# Patient Record
Sex: Male | Born: 1940 | Marital: Married | Smoking: Former smoker
Health system: Southern US, Community
[De-identification: ages and names within clinical notes are randomized; demographics above are authoritative.]

## PROBLEM LIST (undated history)

## (undated) DIAGNOSIS — I219 Acute myocardial infarction, unspecified: Secondary | ICD-10-CM

## (undated) DIAGNOSIS — I2510 Atherosclerotic heart disease of native coronary artery without angina pectoris: Secondary | ICD-10-CM

## (undated) DIAGNOSIS — Z85118 Personal history of other malignant neoplasm of bronchus and lung: Secondary | ICD-10-CM

## (undated) DIAGNOSIS — IMO0002 Reserved for concepts with insufficient information to code with codable children: Secondary | ICD-10-CM

## (undated) DIAGNOSIS — J449 Chronic obstructive pulmonary disease, unspecified: Secondary | ICD-10-CM

## (undated) DIAGNOSIS — E785 Hyperlipidemia, unspecified: Secondary | ICD-10-CM

## (undated) DIAGNOSIS — Z87891 Personal history of nicotine dependence: Secondary | ICD-10-CM

## (undated) DIAGNOSIS — J390 Retropharyngeal and parapharyngeal abscess: Secondary | ICD-10-CM

## (undated) HISTORY — PX: CATARACT EXTRACTION: SUR2

## (undated) HISTORY — PX: COSMETIC SURGERY: SHX468

## (undated) HISTORY — DX: Atherosclerotic heart disease of native coronary artery without angina pectoris: I25.10

## (undated) HISTORY — DX: Acute myocardial infarction, unspecified: I21.9

## (undated) HISTORY — DX: Retropharyngeal and parapharyngeal abscess: J39.0

## (undated) HISTORY — DX: Reserved for concepts with insufficient information to code with codable children: IMO0002

## (undated) HISTORY — DX: Personal history of nicotine dependence: Z87.891

## (undated) HISTORY — DX: Chronic obstructive pulmonary disease, unspecified: J44.9

## (undated) HISTORY — DX: Hyperlipidemia, unspecified: E78.5

## (undated) HISTORY — DX: Personal history of other malignant neoplasm of bronchus and lung: Z85.118

## (undated) HISTORY — PX: HEMORRHOID SURGERY: SHX153

---

## 2006-09-20 ENCOUNTER — Ambulatory Visit: Payer: Self-pay | Admitting: Ophthalmology

## 2007-10-09 ENCOUNTER — Ambulatory Visit: Payer: Self-pay | Admitting: Gastroenterology

## 2008-07-18 DIAGNOSIS — J390 Retropharyngeal and parapharyngeal abscess: Secondary | ICD-10-CM

## 2008-07-18 HISTORY — DX: Retropharyngeal and parapharyngeal abscess: J39.0

## 2008-08-30 ENCOUNTER — Inpatient Hospital Stay: Payer: Self-pay | Admitting: Otolaryngology

## 2009-02-17 ENCOUNTER — Ambulatory Visit: Payer: Self-pay | Admitting: Internal Medicine

## 2009-02-27 ENCOUNTER — Ambulatory Visit: Payer: Self-pay | Admitting: Internal Medicine

## 2009-03-04 ENCOUNTER — Ambulatory Visit: Payer: Self-pay | Admitting: Gastroenterology

## 2009-04-07 ENCOUNTER — Ambulatory Visit: Payer: Self-pay | Admitting: Surgery

## 2009-07-18 DIAGNOSIS — IMO0002 Reserved for concepts with insufficient information to code with codable children: Secondary | ICD-10-CM

## 2009-07-18 HISTORY — DX: Reserved for concepts with insufficient information to code with codable children: IMO0002

## 2009-11-14 ENCOUNTER — Ambulatory Visit: Payer: Self-pay | Admitting: Internal Medicine

## 2009-11-16 ENCOUNTER — Ambulatory Visit: Payer: Self-pay | Admitting: Internal Medicine

## 2009-11-20 ENCOUNTER — Ambulatory Visit: Payer: Self-pay | Admitting: Family Medicine

## 2010-03-02 ENCOUNTER — Ambulatory Visit: Payer: Self-pay | Admitting: Internal Medicine

## 2010-03-12 ENCOUNTER — Ambulatory Visit: Payer: Self-pay | Admitting: Internal Medicine

## 2010-03-18 ENCOUNTER — Ambulatory Visit: Payer: Self-pay | Admitting: Oncology

## 2010-03-26 ENCOUNTER — Ambulatory Visit: Payer: Self-pay | Admitting: Internal Medicine

## 2010-04-14 ENCOUNTER — Ambulatory Visit: Payer: Self-pay | Admitting: Oncology

## 2010-04-17 ENCOUNTER — Ambulatory Visit: Payer: Self-pay | Admitting: Oncology

## 2010-05-02 ENCOUNTER — Emergency Department: Payer: Self-pay | Admitting: Emergency Medicine

## 2010-05-18 ENCOUNTER — Ambulatory Visit: Payer: Self-pay | Admitting: Oncology

## 2010-06-17 ENCOUNTER — Ambulatory Visit: Payer: Self-pay | Admitting: Oncology

## 2010-07-14 ENCOUNTER — Ambulatory Visit: Payer: Self-pay | Admitting: Oncology

## 2010-07-18 ENCOUNTER — Ambulatory Visit: Payer: Self-pay | Admitting: Oncology

## 2010-08-18 ENCOUNTER — Ambulatory Visit: Payer: Self-pay | Admitting: Oncology

## 2010-09-16 ENCOUNTER — Ambulatory Visit: Payer: Self-pay | Admitting: Oncology

## 2010-11-15 ENCOUNTER — Ambulatory Visit: Payer: Self-pay | Admitting: Oncology

## 2010-11-16 ENCOUNTER — Ambulatory Visit: Payer: Self-pay | Admitting: Oncology

## 2011-02-07 ENCOUNTER — Ambulatory Visit: Payer: Self-pay | Admitting: Oncology

## 2011-02-08 ENCOUNTER — Ambulatory Visit: Payer: Self-pay | Admitting: Oncology

## 2011-02-16 ENCOUNTER — Ambulatory Visit: Payer: Self-pay | Admitting: Oncology

## 2011-04-13 ENCOUNTER — Ambulatory Visit: Payer: Self-pay | Admitting: Internal Medicine

## 2011-04-18 ENCOUNTER — Ambulatory Visit: Payer: Self-pay | Admitting: Unknown Physician Specialty

## 2011-05-18 ENCOUNTER — Ambulatory Visit: Payer: Self-pay | Admitting: Ophthalmology

## 2011-05-26 ENCOUNTER — Ambulatory Visit: Payer: Self-pay | Admitting: Oncology

## 2011-06-18 ENCOUNTER — Ambulatory Visit: Payer: Self-pay | Admitting: Oncology

## 2011-07-19 ENCOUNTER — Emergency Department: Payer: Self-pay | Admitting: Emergency Medicine

## 2011-07-19 DIAGNOSIS — I219 Acute myocardial infarction, unspecified: Secondary | ICD-10-CM

## 2011-07-19 HISTORY — DX: Acute myocardial infarction, unspecified: I21.9

## 2011-07-19 LAB — COMPREHENSIVE METABOLIC PANEL
Albumin: 4.3 g/dL (ref 3.4–5.0)
Alkaline Phosphatase: 71 U/L (ref 50–136)
Anion Gap: 10 (ref 7–16)
Bilirubin,Total: 1.9 mg/dL — ABNORMAL HIGH (ref 0.2–1.0)
Calcium, Total: 9.3 mg/dL (ref 8.5–10.1)
Chloride: 103 mmol/L (ref 98–107)
Glucose: 125 mg/dL — ABNORMAL HIGH (ref 65–99)
Osmolality: 283 (ref 275–301)
Potassium: 4.2 mmol/L (ref 3.5–5.1)
SGOT(AST): 26 U/L (ref 15–37)
Sodium: 141 mmol/L (ref 136–145)

## 2011-07-19 LAB — CBC
MCHC: 34.4 g/dL (ref 32.0–36.0)
Platelet: 174 10*3/uL (ref 150–440)
RDW: 13.8 % (ref 11.5–14.5)
WBC: 13.5 10*3/uL — ABNORMAL HIGH (ref 3.8–10.6)

## 2011-07-19 LAB — CK TOTAL AND CKMB (NOT AT ARMC): CK, Total: 389 U/L — ABNORMAL HIGH (ref 35–232)

## 2011-07-25 LAB — EXPECTORATED SPUTUM ASSESSMENT W REFEX TO RESP CULTURE

## 2011-07-27 ENCOUNTER — Ambulatory Visit: Payer: Self-pay | Admitting: Oncology

## 2011-08-18 ENCOUNTER — Ambulatory Visit: Payer: Self-pay | Admitting: Internal Medicine

## 2011-08-19 ENCOUNTER — Ambulatory Visit: Payer: Self-pay | Admitting: Oncology

## 2011-09-12 ENCOUNTER — Ambulatory Visit: Payer: Self-pay | Admitting: Oncology

## 2011-09-14 ENCOUNTER — Ambulatory Visit: Payer: Self-pay | Admitting: Oncology

## 2011-09-14 LAB — COMPREHENSIVE METABOLIC PANEL
Albumin: 4 g/dL (ref 3.4–5.0)
Alkaline Phosphatase: 74 U/L (ref 50–136)
BUN: 12 mg/dL (ref 7–18)
Bilirubin,Total: 1.1 mg/dL — ABNORMAL HIGH (ref 0.2–1.0)
Creatinine: 0.97 mg/dL (ref 0.60–1.30)
EGFR (African American): >60
Potassium: 4.2 mmol/L (ref 3.5–5.1)
SGPT (ALT): 21 U/L
Total Protein: 7.1 g/dL (ref 6.4–8.2)

## 2011-09-14 LAB — CBC CANCER CENTER
Basophil #: 0 x10 3/mm (ref 0.0–0.1)
Basophil %: 0.5 %
Eosinophil #: 0.2 x10 3/mm (ref 0.0–0.7)
Eosinophil %: 4.1 %
HCT: 44 % (ref 40.0–52.0)
Lymphocyte #: 0.8 x10 3/mm — ABNORMAL LOW (ref 1.0–3.6)
MCH: 32.4 pg (ref 26.0–34.0)
MCHC: 34.9 g/dL (ref 32.0–36.0)
MCV: 93 fL (ref 80–100)
Monocyte #: 0.6 x10 3/mm (ref 0.0–0.7)
Monocyte %: 10.2 %
Neutrophil #: 4.1 x10 3/mm (ref 1.4–6.5)
Platelet: 194 x10 3/mm (ref 150–440)
RBC: 4.75 10*6/uL (ref 4.40–5.90)
WBC: 5.8 x10 3/mm (ref 3.8–10.6)

## 2011-09-16 ENCOUNTER — Ambulatory Visit: Payer: Self-pay | Admitting: Oncology

## 2011-10-11 ENCOUNTER — Ambulatory Visit: Payer: Self-pay | Admitting: Internal Medicine

## 2011-10-11 LAB — CBC WITH DIFFERENTIAL/PLATELET
Basophil %: 2.1 %
Lymphocyte #: 0.9 10*3/uL — ABNORMAL LOW (ref 1.0–3.6)
Lymphocyte %: 12 %
MCHC: 33.8 g/dL (ref 32.0–36.0)
Monocyte %: 7.4 %
RBC: 4.88 10*6/uL (ref 4.40–5.90)
RDW: 13 % (ref 11.5–14.5)
WBC: 7.4 10*3/uL (ref 3.8–10.6)

## 2011-10-11 LAB — COMPREHENSIVE METABOLIC PANEL
Albumin: 3.7 g/dL (ref 3.4–5.0)
Alkaline Phosphatase: 88 U/L (ref 50–136)
BUN: 16 mg/dL (ref 7–18)
Bilirubin,Total: 0.9 mg/dL (ref 0.2–1.0)
Calcium, Total: 9.4 mg/dL (ref 8.5–10.1)
Chloride: 105 mmol/L (ref 98–107)
Co2: 33 mmol/L — ABNORMAL HIGH (ref 21–32)
Creatinine: 1.24 mg/dL (ref 0.60–1.30)
EGFR (African American): >60
EGFR (Non-African Amer.): >60
Osmolality: 287 (ref 275–301)
SGOT(AST): 16 U/L (ref 15–37)
Sodium: 142 mmol/L (ref 136–145)
Total Protein: 6.8 g/dL (ref 6.4–8.2)

## 2011-10-17 DIAGNOSIS — I2510 Atherosclerotic heart disease of native coronary artery without angina pectoris: Secondary | ICD-10-CM

## 2011-10-17 HISTORY — DX: Atherosclerotic heart disease of native coronary artery without angina pectoris: I25.10

## 2011-10-17 HISTORY — PX: CORONARY ANGIOPLASTY: SHX604

## 2011-10-26 ENCOUNTER — Inpatient Hospital Stay: Payer: Self-pay | Admitting: Internal Medicine

## 2011-10-26 DIAGNOSIS — R079 Chest pain, unspecified: Secondary | ICD-10-CM

## 2011-10-26 DIAGNOSIS — R748 Abnormal levels of other serum enzymes: Secondary | ICD-10-CM

## 2011-10-26 LAB — COMPREHENSIVE METABOLIC PANEL
Albumin: 4.3 g/dL (ref 3.4–5.0)
Alkaline Phosphatase: 77 U/L (ref 50–136)
BUN: 15 mg/dL (ref 7–18)
Calcium, Total: 9.3 mg/dL (ref 8.5–10.1)
Chloride: 103 mmol/L (ref 98–107)
Co2: 25 mmol/L (ref 21–32)
EGFR (African American): >60
EGFR (Non-African Amer.): >60
Osmolality: 279 (ref 275–301)
SGOT(AST): 19 U/L (ref 15–37)
SGPT (ALT): 23 U/L
Total Protein: 7.6 g/dL (ref 6.4–8.2)

## 2011-10-26 LAB — URINALYSIS, COMPLETE
Bilirubin,UR: NEGATIVE
Ketone: NEGATIVE
Leukocyte Esterase: NEGATIVE
Ph: 5 (ref 4.5–8.0)
Specific Gravity: 1.033 (ref 1.003–1.030)
Squamous Epithelial: <1
WBC UR: 1 /HPF (ref 0–5)

## 2011-10-26 LAB — LIPID PANEL
HDL Cholesterol: 54 mg/dL (ref 40–60)
Ldl Cholesterol, Calc: 129 mg/dL — ABNORMAL HIGH (ref 0–100)
Triglycerides: 101 mg/dL (ref 0–200)

## 2011-10-26 LAB — CBC
MCH: 31 pg (ref 26.0–34.0)
MCHC: 33 g/dL (ref 32.0–36.0)
Platelet: 169 10*3/uL (ref 150–440)
RBC: 5.1 10*6/uL (ref 4.40–5.90)
WBC: 5.6 10*3/uL (ref 3.8–10.6)

## 2011-10-26 LAB — TROPONIN I: Troponin-I: <0.02 ng/mL

## 2011-10-26 LAB — CK TOTAL AND CKMB (NOT AT ARMC)
CK, Total: 66 U/L (ref 35–232)
CK-MB: 127.9 ng/mL — ABNORMAL HIGH (ref 0.5–3.6)
CK-MB: 68.7 ng/mL — ABNORMAL HIGH (ref 0.5–3.6)

## 2011-10-26 LAB — PRO B NATRIURETIC PEPTIDE: B-Type Natriuretic Peptide: 74 pg/mL (ref 0–125)

## 2011-10-27 DIAGNOSIS — I517 Cardiomegaly: Secondary | ICD-10-CM

## 2011-10-27 DIAGNOSIS — I2510 Atherosclerotic heart disease of native coronary artery without angina pectoris: Secondary | ICD-10-CM

## 2011-10-27 LAB — CBC WITH DIFFERENTIAL/PLATELET
Basophil #: 0 10*3/uL (ref 0.0–0.1)
Eosinophil #: 0.2 10*3/uL (ref 0.0–0.7)
Lymphocyte #: 1 10*3/uL (ref 1.0–3.6)
Lymphocyte %: 17.5 %
Monocyte #: 0.5 x10 3/mm (ref 0.2–1.0)
Neutrophil #: 3.9 10*3/uL (ref 1.4–6.5)
RBC: 4.19 10*6/uL — ABNORMAL LOW (ref 4.40–5.90)
RDW: 13.3 % (ref 11.5–14.5)
WBC: 5.5 10*3/uL (ref 3.8–10.6)

## 2011-10-27 LAB — CK TOTAL AND CKMB (NOT AT ARMC)
CK, Total: 352 U/L — ABNORMAL HIGH (ref 35–232)
CK-MB: 32.7 ng/mL — ABNORMAL HIGH (ref 0.5–3.6)

## 2011-10-27 LAB — BASIC METABOLIC PANEL
Anion Gap: 7 (ref 7–16)
Chloride: 105 mmol/L (ref 98–107)
EGFR (African American): >60
Glucose: 100 mg/dL — ABNORMAL HIGH (ref 65–99)
Osmolality: 280 (ref 275–301)
Sodium: 140 mmol/L (ref 136–145)

## 2011-10-27 LAB — TROPONIN I: Troponin-I: 17.91 ng/mL — ABNORMAL HIGH

## 2011-10-28 LAB — BASIC METABOLIC PANEL
Anion Gap: 10 (ref 7–16)
BUN: 11 mg/dL (ref 7–18)
Calcium, Total: 8 mg/dL — ABNORMAL LOW (ref 8.5–10.1)
Chloride: 107 mmol/L (ref 98–107)
Co2: 27 mmol/L (ref 21–32)
EGFR (African American): >60
Glucose: 88 mg/dL (ref 65–99)
Osmolality: 286 (ref 275–301)
Sodium: 144 mmol/L (ref 136–145)

## 2011-11-07 ENCOUNTER — Encounter: Payer: Self-pay | Admitting: Cardiovascular Disease

## 2011-11-07 ENCOUNTER — Ambulatory Visit (INDEPENDENT_AMBULATORY_CARE_PROVIDER_SITE_OTHER): Payer: Medicare Other | Admitting: Cardiovascular Disease

## 2011-11-07 DIAGNOSIS — R079 Chest pain, unspecified: Secondary | ICD-10-CM

## 2011-11-07 DIAGNOSIS — E785 Hyperlipidemia, unspecified: Secondary | ICD-10-CM

## 2011-11-07 DIAGNOSIS — I2510 Atherosclerotic heart disease of native coronary artery without angina pectoris: Secondary | ICD-10-CM | POA: Insufficient documentation

## 2011-11-07 DIAGNOSIS — I429 Cardiomyopathy, unspecified: Secondary | ICD-10-CM | POA: Insufficient documentation

## 2011-11-07 DIAGNOSIS — I428 Other cardiomyopathies: Secondary | ICD-10-CM

## 2011-11-07 NOTE — Assessment & Plan Note (Signed)
The patient had a recent large size non-ST elevation myocardial infarction. He had an angioplasty and drug-eluting stent placement to the proximal LAD which was a difficult procedure due to heavy calcifications resulted in jailing the first diagonal. Since hospital discharge, the patient continued to have mild chest discomfort but has not used nitroglycerin. He is reporting easy bruising with aspirin and Plavix. Thus, I recommend decreasing the dose of aspirin to 81 mg once daily. Continue other cardiac medications. I instructed him on the proper use of sublingual nitroglycerin. I advised him to call 911 if chest pain does not resolve after one nitroglycerin. I asked him to continue to monitor his symptoms to notify us with any unusual chest pain or exertional chest pain. If he continues to have chest pain, I would have a low threshold for coronary angiography to ensure that the LAD stent is patent.

## 2011-11-07 NOTE — Progress Notes (Signed)
HPI  This is a 71 year old man who is here today for a followup visit after recent hospitalization at [facility]. He presented there with non-ST elevation myocardial infarction. Troponin peaked at 40. He underwent cardiac catheterization which showed significant proximal LAD stenosis which was heavily calcified with moderate disease in the OM1. He had an angioplasty and drug-eluting stent placement done by Dr. Juliann Pares. The procedure was difficult to to calcifications and difficulty in fully expanding the stent. The stent was ultimately post dilated with a 3.5 noncompliant balloon with good results. However, the first diagonal was jailed by the stent which resulted in TIMI 1 flow. The patient did have chest pain at that time that did not last for a long time. Since hospital discharge, the patient has been feeling tired and fatigued. He had few episodes of chest pain but has not used nitroglycerin because the discomfort was mild. He is able to perform his activities of daily living without significant exertional symptoms. He has not taken lisinopril due to concerns about low blood pressure and feeling tired.  Allergies  Allergen Reactions  . Aspirin   . Levofloxacin      Current Outpatient Prescriptions on File Prior to Visit  Medication Sig Dispense Refill  . aspirin 325 MG EC tablet Take 325 mg by mouth daily.      Marland Kitchen atorvastatin (LIPITOR) 40 MG tablet Take 40 mg by mouth daily.      . calcium carbonate (TUMS - DOSED IN MG ELEMENTAL CALCIUM) 500 MG chewable tablet Chew 1 tablet by mouth daily.      . Cholecalciferol (VITAMIN D3) 1000 UNITS CAPS Take 1,000 Units by mouth daily.      . clopidogrel (PLAVIX) 75 MG tablet Take 75 mg by mouth daily.      . Cyanocobalamin (VITAMIN B-12 CR PO) Take by mouth.      . metoprolol tartrate (LOPRESSOR) 25 MG tablet Take 25 mg by mouth 2 (two) times daily.      . nitroGLYCERIN (NITROSTAT) 0.4 MG SL tablet Place 0.4 mg under the tongue every 5 (five) minutes as  needed.         Past Medical History  Diagnosis Date  . COPD (chronic obstructive pulmonary disease)   . Abscess of lateral pharyngeal space 2010  . Ex-smoker   . Acute MI 2013  . H/O: lung cancer     in remission  . 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 Coronary artery disease 10/2011    NSTEMI. Cardiac cath: 80% proximal LAD stenosis (heavily calcifed), 60% Om1, EF 40-45%. Proximal LAD PCI and DES placement. Jailed first diagonal with TIMI 1 flow.   . Hyperlipidemia   . Cardiomyopathy     EF; 40-45%     Past Surgical History  Procedure Date  . Cataract extraction   . Hemorrhoid surgery   . Cosmetic surgery     right hand for trauma.  . Cardiac catheterization   . Coronary angioplasty 04/13    Proximal LAD. 3.0 X23 mm Promus DES     History reviewed. No pertinent family history.   History   Social History  . Marital Status: Married    Spouse Name: N/A    Number of Children: N/A  . Years of Education: N/A   Occupational History  . Not on file.   Social History Main Topics  . Smoking status: Former Smoker -- 2.0 packs/day for 45 years    Types:  Cigarettes    Quit date: 11/04/2007  . Smokeless tobacco: Not on file  . Alcohol Use: 1.2 oz/week    2 Cans of beer per week  . Drug Use: No  . Sexually Active:    Other Topics Concern  . Not on file   Social History Narrative  . No narrative on file     PHYSICAL EXAM   BP 122/74  Pulse 69  Ht 6\' 2"  (1.88 m)  Wt 194 lb 8 oz (88.225 kg)  BMI 24.97 kg/m2  Constitutional: He is oriented to person, place, and time. He appears well-developed and well-nourished. No distress.  HENT: No nasal discharge.  Head: Normocephalic and atraumatic.  Eyes: Pupils are equal and round. Right eye exhibits no discharge. Left eye exhibits no discharge.  Neck: Normal range of motion. Neck supple. No JVD present. No thyromegaly present.  Cardiovascular: Normal rate, regular rhythm, normal heart  sounds and. Exam reveals no gallop and no friction rub. No murmur heard.  Pulmonary/Chest: Effort normal and breath sounds normal. No stridor. No respiratory distress. He has no wheezes. He has no rales. He exhibits no tenderness.  Abdominal: Soft. Bowel sounds are normal. He exhibits no distension. There is no tenderness. There is no rebound and no guarding.  Musculoskeletal: Normal range of motion. He exhibits no edema and no tenderness.  Neurological: He is alert and oriented to person, place, and time. Coordination normal.  Skin: Skin is warm and dry. No rash noted. He is not diaphoretic. No erythema. No pallor.  Psychiatric: He has a normal mood and affect. His behavior is normal. Judgment and thought content normal.  Right groin, no hematoma or bruit.     EKG: Normal sinus rhythm with septal Q waves. Minor J-point elevation in the inferior leads with prominent T waves. There is T-wave inversion in aVL. The inferior ST changes do not seem to be new compared to his ECG is at the hospital.   ASSESSMENT AND PLAN

## 2011-11-07 NOTE — Assessment & Plan Note (Signed)
He will need a followup lipid and liver profile. Target LDL is less than 70

## 2011-11-07 NOTE — Assessment & Plan Note (Signed)
I recommend checking an echocardiogram to evaluate his LV systolic function and to see if there is any explanation for his continued chest pain other than the residual disease and that diagonal. If his ejection fraction is less than 55%, I plan to resume treatment with lisinopril. The patient did not take his medication since hospital discharge due to concerns about low blood pressure and fatigue.

## 2011-11-07 NOTE — Patient Instructions (Signed)
Decrease Aspirin to 81 mg once daily.   Your physician has requested that you have an echocardiogram. Echocardiography is a painless test that uses sound waves to create images of your heart. It provides your doctor with information about the size and shape of your heart and how well your heart's chambers and valves are working. This procedure takes approximately one hour. There are no restrictions for this procedure.  follow up after Echocardiogram.

## 2011-11-10 ENCOUNTER — Encounter: Payer: Self-pay | Admitting: Cardiovascular Disease

## 2011-11-24 ENCOUNTER — Ambulatory Visit (INDEPENDENT_AMBULATORY_CARE_PROVIDER_SITE_OTHER): Payer: Medicare Other | Admitting: *Deleted

## 2011-11-24 ENCOUNTER — Other Ambulatory Visit: Payer: Self-pay

## 2011-11-24 DIAGNOSIS — I2510 Atherosclerotic heart disease of native coronary artery without angina pectoris: Secondary | ICD-10-CM

## 2011-11-24 DIAGNOSIS — I428 Other cardiomyopathies: Secondary | ICD-10-CM

## 2011-11-24 DIAGNOSIS — R079 Chest pain, unspecified: Secondary | ICD-10-CM

## 2011-11-28 ENCOUNTER — Ambulatory Visit (INDEPENDENT_AMBULATORY_CARE_PROVIDER_SITE_OTHER): Payer: Medicare Other | Admitting: Cardiovascular Disease

## 2011-11-28 ENCOUNTER — Encounter: Payer: Self-pay | Admitting: Cardiovascular Disease

## 2011-11-28 DIAGNOSIS — R079 Chest pain, unspecified: Secondary | ICD-10-CM

## 2011-11-28 DIAGNOSIS — E785 Hyperlipidemia, unspecified: Secondary | ICD-10-CM

## 2011-11-28 DIAGNOSIS — I428 Other cardiomyopathies: Secondary | ICD-10-CM

## 2011-11-28 DIAGNOSIS — I2510 Atherosclerotic heart disease of native coronary artery without angina pectoris: Secondary | ICD-10-CM

## 2011-11-28 NOTE — Patient Instructions (Addendum)
Your physician has requested that you have an exercise tolerance test. For further information please visit [email or web address] Please also follow instruction sheet, as given.  You need to have fasting labs to check cholesterol and liver test to be done on Nov 29, 2011 at 8:30.  Your physician recommends that you schedule a follow-up appointment in: 

## 2011-11-28 NOTE — Progress Notes (Signed)
HPI  This is a 71 year old man who is here today for a followup visit. He had a non-ST elevation myocardial infarction last month. Troponin peaked at 40. He underwent cardiac catheterization which showed significant proximal LAD stenosis which was heavily calcified with moderate disease in the OM1. He had an angioplasty and drug-eluting stent placement done by Dr. Juliann Pares. The procedure was difficult to to calcifications and difficulty in fully expanding the stent. The stent was ultimately post dilated with a 3.5 noncompliant balloon with good results. However, the first diagonal was jailed by the stent which resulted in TIMI 1 flow. The patient did have chest pain at that time that did not last for a long time.  During his last visit, the patient reported frequent episodes of substernal chest pain. He was given sublingual nitroglycerin to be used as needed. He had one episode Friday which required one nitroglycerin and resolved his symptoms completely. He reports substernal burning sensation starting in the lower substernal area all the way up to his neck. He does have previous history of GERD and does not tolerate aspirin very well. He is supposed to start cardiac rehabilitation tomorrow.  Allergies  Allergen Reactions  . Aspirin   . Levofloxacin      Current Outpatient Prescriptions on File Prior to Visit  Medication Sig Dispense Refill  . aspirin 325 MG EC tablet Take 81 mg by mouth daily.       Marland Kitchen atorvastatin (LIPITOR) 40 MG tablet Take 40 mg by mouth daily.      . calcium carbonate (TUMS - DOSED IN MG ELEMENTAL CALCIUM) 500 MG chewable tablet Chew 1 tablet by mouth daily.      . Cholecalciferol (VITAMIN D3) 1000 UNITS CAPS Take 1,000 Units by mouth daily.      . clopidogrel (PLAVIX) 75 MG tablet Take 75 mg by mouth daily.      . Cyanocobalamin (VITAMIN B-12 CR PO) Take by mouth.      . metoprolol tartrate (LOPRESSOR) 25 MG tablet Take 25 mg by mouth 2 (two) times daily.      .  nitroGLYCERIN (NITROSTAT) 0.4 MG SL tablet Place 0.4 mg under the tongue every 5 (five) minutes as needed.         Past Medical History  Diagnosis Date  . COPD (chronic obstructive pulmonary disease)   . Abscess of lateral pharyngeal space 2010  . Ex-smoker   . Acute MI 2013  . H/O: lung cancer     in remission  . 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 Coronary artery disease 10/2011    NSTEMI. Cardiac cath: 80% proximal LAD stenosis (heavily calcifed), 60% Om1, EF 40-45%. Proximal LAD PCI and DES placement. Jailed first diagonal with TIMI 1 flow.   . Hyperlipidemia   . Cardiomyopathy     EF; 40-45%     Past Surgical History  Procedure Date  . Cataract extraction   . Hemorrhoid surgery   . Cosmetic surgery     right hand for trauma.  . Cardiac catheterization   . Coronary angioplasty 04/13    Proximal LAD. 3.0 X23 mm Promus DES (Done by Dr. Juliann Pares), Jailed D1 with TIMI 1 flow     History reviewed. No pertinent family history.   History   Social History  . Marital Status: Married    Spouse Name: N/A    Number of Children: N/A  . Years of Education: N/A   Occupational  History  . Not on file.   Social History Main Topics  . Smoking status: Former Smoker -- 2.0 packs/day for 45 years    Types: Cigarettes    Quit date: 11/04/2007  . Smokeless tobacco: Not on file  . Alcohol Use: 1.2 oz/week    2 Cans of beer per week  . Drug Use: No  . Sexually Active:    Other Topics Concern  . Not on file   Social History Narrative  . No narrative on file     PHYSICAL EXAM   BP 117/87  Pulse 70  Ht 6\' 2"  (1.88 m)  Wt 197 lb 1.9 oz (89.413 kg)  BMI 25.31 kg/m2  Constitutional: He is oriented to person, place, and time. He appears well-developed and well-nourished. No distress.  HENT: No nasal discharge.  Head: Normocephalic and atraumatic.  Eyes: Pupils are equal and round. Right eye exhibits no discharge. Left eye exhibits no  discharge.  Neck: Normal range of motion. Neck supple. No JVD present. No thyromegaly present.  Cardiovascular: Normal rate, regular rhythm, normal heart sounds and. Exam reveals no gallop and no friction rub. No murmur heard.  Pulmonary/Chest: Effort normal and breath sounds normal. No stridor. No respiratory distress. He has no wheezes. He has no rales. He exhibits no tenderness.  Abdominal: Soft. Bowel sounds are normal. He exhibits no distension. There is no tenderness. There is no rebound and no guarding.  Musculoskeletal: Normal range of motion. He exhibits no edema and no tenderness.  Neurological: He is alert and oriented to person, place, and time. Coordination normal.  Skin: Skin is warm and dry. No rash noted. He is not diaphoretic. No erythema. No pallor.  Psychiatric: He has a normal mood and affect. His behavior is normal. Judgment and thought content normal.      EKG: Sinus  Rhythm  - Minor J-point elevation suggestive of early repolarization. No significant change from most recent ECG.   ASSESSMENT AND PLAN

## 2011-11-28 NOTE — Assessment & Plan Note (Signed)
We'll obtain a fasting lipid and liver profile.

## 2011-11-28 NOTE — Assessment & Plan Note (Signed)
We did an echocardiogram which showed normal LV systolic function overall. Continue metoprolol. Keep him off Lisinopril due to hypotension it happened while he was on small dose.

## 2011-11-28 NOTE — Procedures (Signed)
    Treadmill Stress test  Indication: Chest pain.  Baseline Data:  Resting EKG shows NSR with rate of  72 bpm, J-point elevation suggestive of early repolarization Resting blood pressure of 126/64 mm Hg Stand bruce protocal was used.  Exercise Data:  Patient exercised for 1 min 34 sec,  Peak heart rate of 100 bpm.  This was 66 % of the maximum predicted heart rate. The patient had severe shortness of breath without chest pain which required termination of the test.   Peak Blood pressure recorded was 144/62 Maximal work level: 4.6 METs.  Heart rate at 3 minutes in recovery was 74 bpm. BP response: Normal HR response: Normal  EKG with Exercise: Sinus tachycardia with no significant ST changes.  FINAL IMPRESSION: Nondiagnostic exercise stress test due to inability to achieve at least 75% of maximal predicted heart rate. No significant EKG changes concerning for ischemia at this level of stress. Poor exercise tolerance.  Recommendation: The patient had significant dyspnea which according to him is chronic and due to his advanced lung disease. He could not exercise for more than one and a half minutes. He did not have chest pain. He is going to start cardiac rehabilitation tomorrow I suspect a very low level.  I will have him followup with me in one month to reevaluate his symptoms. If he continues to have chest pain, I will consider a pharmacologic nuclear stress test or proceeding directly with cardiac catheterization.

## 2011-11-28 NOTE — Assessment & Plan Note (Signed)
The patient had a recent large size non-ST elevation myocardial infarction last month. He had an angioplasty and drug-eluting stent placement to the proximal LAD which was a difficult procedure due to heavy calcifications resulted in jailing the first diagonal.  He continues to have different kinds of chest discomfort some of it is suggestive of GERD. He has known history of intolerance to aspirin which was decreased in the last visit to 81 mg once daily. His chest pain seems to be overall better than the last visit. He is able to perform his physical activities with more stamina. He is supposed to start cardiac rehabilitation tomorrow. I will obtain a treadmill stress test today before starting cardiac rehabilitation given his continued symptoms of intermittent chest pain. Some of his symptoms are likely related to the jailed first diagonal.

## 2011-11-29 ENCOUNTER — Ambulatory Visit: Payer: Medicare Other

## 2011-11-29 ENCOUNTER — Encounter: Payer: Self-pay | Admitting: Cardiovascular Disease

## 2011-11-29 DIAGNOSIS — E785 Hyperlipidemia, unspecified: Secondary | ICD-10-CM

## 2011-11-30 LAB — LIPID PANEL
Cholesterol, Total: 172 mg/dL (ref 100–199)
HDL: 63 mg/dL (ref >39)
Triglycerides: 127 mg/dL (ref 0–149)
VLDL Cholesterol Cal: 25 mg/dL (ref 5–40)

## 2011-11-30 LAB — HEPATIC FUNCTION PANEL
ALT: 20 IU/L (ref 0–44)
AST: 22 IU/L (ref 0–40)
Alkaline Phosphatase: 81 IU/L (ref 25–160)
Bilirubin, Direct: 0.34 mg/dL (ref 0.00–0.40)
Total Protein: 6.9 g/dL (ref 6.0–8.5)

## 2011-12-05 ENCOUNTER — Telehealth: Payer: Self-pay | Admitting: Cardiovascular Disease

## 2011-12-05 NOTE — Telephone Encounter (Signed)
Pt came into office wanting lab results, informed pt I would have nurse call with results once Dr Kirke Corin reviewed them

## 2011-12-05 NOTE — Telephone Encounter (Signed)
Sent to Sabino Snipes, RN for review

## 2011-12-06 ENCOUNTER — Telehealth: Payer: Self-pay

## 2011-12-06 NOTE — Telephone Encounter (Signed)
Pt called for lab results. Results given to pt. He would like to pick up results as well tomm. Will leave at FD. ----- Message ----- From: Marcelle Overlie, RN Sent: 12/06/2011 8:31 AM To: Festus Aloe, CMA Subject: RE: labs     ----- Message ----- From: Iran Ouch, MD Sent: 12/05/2011 4:34 PM To: Marcelle Overlie, RN Subject: RE: labs   Liver test is stable. Cholesterol is much better. Continue current treatment.   ----- Message ----- From: Marcelle Overlie, RN Sent: 12/05/2011 12:14 PM To: Iran Ouch, MD Subject: labs   Hi! Patient calling for results of L/L. Looks like lipids have improved but bilirubin still elevated. Thanks!

## 2011-12-06 NOTE — Telephone Encounter (Signed)
Error

## 2011-12-17 HISTORY — PX: CARDIAC CATHETERIZATION: SHX172

## 2011-12-22 ENCOUNTER — Other Ambulatory Visit: Payer: Self-pay | Admitting: Cardiovascular Disease

## 2011-12-22 MED ORDER — ATORVASTATIN CALCIUM 40 MG PO TABS: 40.0000 mg | ORAL_TABLET | Freq: Every day | ORAL | Status: AC

## 2011-12-22 MED ORDER — METOPROLOL TARTRATE 25 MG PO TABS: 25.0000 mg | ORAL_TABLET | Freq: Two times a day (BID) | ORAL | Status: DC

## 2011-12-22 MED ORDER — CLOPIDOGREL BISULFATE 75 MG PO TABS: 75.0000 mg | ORAL_TABLET | Freq: Every day | ORAL | Status: DC

## 2011-12-23 ENCOUNTER — Encounter: Payer: Self-pay | Admitting: Cardiovascular Disease

## 2011-12-26 ENCOUNTER — Ambulatory Visit: Payer: Self-pay

## 2011-12-26 LAB — URINALYSIS, COMPLETE
Bilirubin,UR: NEGATIVE
Ketone: NEGATIVE
Leukocyte Esterase: NEGATIVE
Ph: 6 (ref 4.5–8.0)
Protein: NEGATIVE
RBC,UR: 3 /HPF (ref 0–5)
Specific Gravity: 1.02 (ref 1.003–1.030)
Squamous Epithelial: NONE SEEN
WBC UR: 2 /HPF (ref 0–5)

## 2011-12-26 LAB — CBC
HGB: 14.6 g/dL (ref 13.0–18.0)
MCHC: 33.7 g/dL (ref 32.0–36.0)
RDW: 13.2 % (ref 11.5–14.5)
WBC: 8 10*3/uL (ref 3.8–10.6)

## 2011-12-26 LAB — COMPREHENSIVE METABOLIC PANEL
Albumin: 3.8 g/dL (ref 3.4–5.0)
Alkaline Phosphatase: 86 U/L (ref 50–136)
Anion Gap: 6 — ABNORMAL LOW (ref 7–16)
BUN: 14 mg/dL (ref 7–18)
Bilirubin,Total: 1.2 mg/dL — ABNORMAL HIGH (ref 0.2–1.0)
Calcium, Total: 9.1 mg/dL (ref 8.5–10.1)
Chloride: 105 mmol/L (ref 98–107)
Co2: 29 mmol/L (ref 21–32)
EGFR (Non-African Amer.): >60
Glucose: 113 mg/dL — ABNORMAL HIGH (ref 65–99)
SGOT(AST): 23 U/L (ref 15–37)
SGPT (ALT): 30 U/L
Total Protein: 6.9 g/dL (ref 6.4–8.2)

## 2011-12-26 LAB — TROPONIN I: Troponin-I: <0.02 ng/mL

## 2011-12-26 LAB — CK TOTAL AND CKMB (NOT AT ARMC)
CK, Total: 61 U/L (ref 35–232)
CK, Total: 59 U/L (ref 35–232)
CK-MB: 1.6 ng/mL (ref 0.5–3.6)

## 2011-12-27 ENCOUNTER — Inpatient Hospital Stay: Payer: Self-pay | Admitting: Internal Medicine

## 2011-12-27 DIAGNOSIS — I2510 Atherosclerotic heart disease of native coronary artery without angina pectoris: Secondary | ICD-10-CM

## 2011-12-27 DIAGNOSIS — R079 Chest pain, unspecified: Secondary | ICD-10-CM

## 2011-12-27 LAB — CK TOTAL AND CKMB (NOT AT ARMC)
CK, Total: 52 U/L (ref 35–232)
CK-MB: 1.2 ng/mL (ref 0.5–3.6)

## 2011-12-28 ENCOUNTER — Telehealth: Payer: Self-pay

## 2011-12-28 NOTE — Telephone Encounter (Signed)
Pt states he did not have to go to jury duty this am so he does not need a note.

## 2011-12-30 ENCOUNTER — Ambulatory Visit: Payer: Medicare Other | Admitting: Cardiovascular Disease

## 2012-01-06 ENCOUNTER — Ambulatory Visit (INDEPENDENT_AMBULATORY_CARE_PROVIDER_SITE_OTHER): Payer: Medicare Other | Admitting: Cardiovascular Disease

## 2012-01-06 ENCOUNTER — Encounter: Payer: Self-pay | Admitting: Cardiovascular Disease

## 2012-01-06 VITALS — BP 118/72 | HR 65 | Ht 74.0 in | Wt 200.0 lb

## 2012-01-06 DIAGNOSIS — E785 Hyperlipidemia, unspecified: Secondary | ICD-10-CM

## 2012-01-06 DIAGNOSIS — I2510 Atherosclerotic heart disease of native coronary artery without angina pectoris: Secondary | ICD-10-CM

## 2012-01-06 NOTE — Assessment & Plan Note (Signed)
His ejection fraction normalized on present cardiac catheterization. Continue treatment with small dose metoprolol and lisinopril.

## 2012-01-06 NOTE — Assessment & Plan Note (Signed)
The patient is overall doing better. Recent cardiac catheterization showed patent LAD stent and no other significant obstructive disease other than in the first diagonal which was jailed by the stent. Recommend continuing medical therapy. He is having easy bruising but will have to continue dual antiplatelet therapy until April of 2014.  I asked him to resume cardiac rehabilitation.

## 2012-01-06 NOTE — Patient Instructions (Addendum)
Resume cardiac rehab.  Continue same medications.  Follow up in 6 months

## 2012-01-06 NOTE — Progress Notes (Signed)
HPI  This is a 71 year old man who is here today for a followup visit. He had a non-ST elevation myocardial infarction in April. Troponin peaked at 40. He underwent cardiac catheterization which showed significant proximal LAD stenosis which was heavily calcified with moderate disease in the OM1. He had an angioplasty and drug-eluting stent placement done by Dr. Juliann Pares. The procedure was difficult to to calcifications and difficulty in fully expanding the stent. . However, the first diagonal was jailed by the stent which resulted in TIMI 1 flow.  He has been having recurrent symptoms of chest pain and dyspnea since then. He presented to [facility] last week with palpitations and mild chest discomfort. He ruled out for myocardial infarction. He underwent cardiac catheterization which showed patent LAD stent with significant ostial disease in first diagonal which was jailed by the stent.  He reports improvement in his symptoms. He had no recurrent chest pain or increase in his dyspnea.  Allergies  Allergen Reactions  . Aspirin   . Levofloxacin      Current Outpatient Prescriptions on File Prior to Visit  Medication Sig Dispense Refill  . atorvastatin (LIPITOR) 40 MG tablet Take 1 tablet (40 mg total) by mouth daily.  90 tablet  3  . calcium carbonate (TUMS - DOSED IN MG ELEMENTAL CALCIUM) 500 MG chewable tablet Chew 2 tablets by mouth daily.       . Cholecalciferol (VITAMIN D3) 1000 UNITS CAPS Take 1,000 Units by mouth daily.      . clopidogrel (PLAVIX) 75 MG tablet Take 1 tablet (75 mg total) by mouth daily.  90 tablet  3  . Cyanocobalamin (VITAMIN B-12 CR PO) Take by mouth.      . metoprolol tartrate (LOPRESSOR) 25 MG tablet Take 1 tablet (25 mg total) by mouth 2 (two) times daily.  90 tablet  3  . nitroGLYCERIN (NITROSTAT) 0.4 MG SL tablet Place 0.4 mg under the tongue every 5 (five) minutes as needed.      Marland Kitchen SPIRIVA HANDIHALER 18 MCG inhalation capsule as directed.          Past Medical  History  Diagnosis Date  . COPD (chronic obstructive pulmonary disease)   . Abscess of lateral pharyngeal space 2010  . Ex-smoker   . Acute MI 2013  . H/O: lung cancer     in remission  . 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 Hyperlipidemia   . Cardiomyopathy     EF; 40-45%  . Coronary artery disease 10/2011    NSTEMI. Cardiac cath: 80% proximal LAD stenosis (heavily calcifed), 60% Om1, EF 40-45%. Proximal LAD PCI and DES placement. Jailed first diagonal with TIMI 1 flow. repeat cath 06/13: patent stent     Past Surgical History  Procedure Date  . Cataract extraction   . Hemorrhoid surgery   . Cosmetic surgery     right hand for trauma.  . Cardiac catheterization 12/2011    [facility]  . Coronary angioplasty 04/13    Proximal LAD. 3.0 X23 mm Promus DES (Done by Dr. Juliann Pares), Jailed D1 with TIMI 1 flow     History reviewed. No pertinent family history.   History   Social History  . Marital Status: Married    Spouse Name: N/A    Number of Children: N/A  . Years of Education: N/A   Occupational History  . Not on file.   Social History Main Topics  . Smoking status: Former Smoker --  2.0 packs/day for 45 years    Types: Cigarettes    Quit date: 11/04/2007  . Smokeless tobacco: Not on file  . Alcohol Use: 1.2 oz/week    2 Cans of beer per week  . Drug Use: No  . Sexually Active:    Other Topics Concern  . Not on file   Social History Narrative  . No narrative on file     PHYSICAL EXAM   BP 118/72  Pulse 65  Ht 6\' 2"  (1.88 m)  Wt 200 lb (90.719 kg)  BMI 25.68 kg/m2 Constitutional: He is oriented to person, place, and time. He appears well-developed and well-nourished. No distress.  HENT: No nasal discharge.  Head: Normocephalic and atraumatic.  Eyes: Pupils are equal and round. Right eye exhibits no discharge. Left eye exhibits no discharge.  Neck: Normal range of motion. Neck supple. No JVD present. No thyromegaly present.    Cardiovascular: Normal rate, regular rhythm, normal heart sounds and. Exam reveals no gallop and no friction rub. No murmur heard.  Pulmonary/Chest: Effort normal and breath sounds normal. No stridor. No respiratory distress. He has no wheezes. He has no rales. He exhibits no tenderness.  Abdominal: Soft. Bowel sounds are normal. He exhibits no distension. There is no tenderness. There is no rebound and no guarding.  Musculoskeletal: Normal range of motion. He exhibits no edema and no tenderness.  Neurological: He is alert and oriented to person, place, and time. Coordination normal.  Skin: Skin is warm and dry. Bruising and upper extremities noted. He is not diaphoretic. No erythema. No pallor.  Psychiatric: He has a normal mood and affect. His behavior is normal. Judgment and thought content normal.   Right groin: No hematoma or bruit.     EKG: Sinus  Rhythm  -Inferior ST-elevation -repolarization variant.   -  Nonspecific T-abnormality.     ASSESSMENT AND PLAN

## 2012-01-06 NOTE — Assessment & Plan Note (Signed)
Continue treatment with atorvastatin with goal LDL is less than 70. He will need a repeat lipid profile in about 6 months.  Lab Results  Component Value Date   HDL 63 11/29/2011   LDLCALC 84 11/29/2011   TRIG 127 11/29/2011   CHOLHDL 2.7 11/29/2011

## 2012-01-10 ENCOUNTER — Ambulatory Visit: Payer: Self-pay | Admitting: Oncology

## 2012-01-10 LAB — CBC CANCER CENTER
Basophil %: 1.1 %
Eosinophil #: 0.3 x10 3/mm (ref 0.0–0.7)
HCT: 43.6 % (ref 40.0–52.0)
HGB: 14.4 g/dL (ref 13.0–18.0)
Lymphocyte #: 0.6 x10 3/mm — ABNORMAL LOW (ref 1.0–3.6)
MCH: 31.3 pg (ref 26.0–34.0)
MCHC: 33 g/dL (ref 32.0–36.0)
Monocyte #: 0.6 x10 3/mm (ref 0.2–1.0)
Monocyte %: 9.5 %
Neutrophil #: 4.9 x10 3/mm (ref 1.4–6.5)
Neutrophil %: 75.1 %
Platelet: 160 x10 3/mm (ref 150–440)
WBC: 6.5 x10 3/mm (ref 3.8–10.6)

## 2012-01-10 LAB — COMPREHENSIVE METABOLIC PANEL
Albumin: 3.9 g/dL (ref 3.4–5.0)
Alkaline Phosphatase: 81 U/L (ref 50–136)
Anion Gap: 7 (ref 7–16)
Chloride: 103 mmol/L (ref 98–107)
Co2: 29 mmol/L (ref 21–32)
EGFR (African American): >60
EGFR (Non-African Amer.): >60
Osmolality: 278 (ref 275–301)
SGOT(AST): 22 U/L (ref 15–37)
SGPT (ALT): 29 U/L

## 2012-01-16 ENCOUNTER — Encounter: Payer: Self-pay | Admitting: Cardiovascular Disease

## 2012-01-16 ENCOUNTER — Ambulatory Visit: Payer: Self-pay | Admitting: Oncology

## 2012-02-16 ENCOUNTER — Encounter: Payer: Self-pay | Admitting: Cardiovascular Disease

## 2012-05-30 ENCOUNTER — Other Ambulatory Visit: Payer: Self-pay | Admitting: *Deleted

## 2012-05-30 MED ORDER — METOPROLOL TARTRATE 25 MG PO TABS: 25.0000 mg | ORAL_TABLET | Freq: Two times a day (BID) | ORAL | Status: DC

## 2012-05-30 NOTE — Telephone Encounter (Signed)
Refilled Metoprolol

## 2012-06-17 ENCOUNTER — Ambulatory Visit: Payer: Self-pay | Admitting: Medical

## 2012-07-17 ENCOUNTER — Ambulatory Visit (INDEPENDENT_AMBULATORY_CARE_PROVIDER_SITE_OTHER): Payer: Medicare Other | Admitting: Cardiovascular Disease

## 2012-07-17 ENCOUNTER — Encounter: Payer: Self-pay | Admitting: Cardiovascular Disease

## 2012-07-17 VITALS — BP 102/74 | HR 85 | Ht 74.0 in | Wt 210.0 lb

## 2012-07-17 DIAGNOSIS — I2510 Atherosclerotic heart disease of native coronary artery without angina pectoris: Secondary | ICD-10-CM

## 2012-07-17 DIAGNOSIS — R0602 Shortness of breath: Secondary | ICD-10-CM

## 2012-07-17 DIAGNOSIS — E785 Hyperlipidemia, unspecified: Secondary | ICD-10-CM

## 2012-07-17 DIAGNOSIS — R079 Chest pain, unspecified: Secondary | ICD-10-CM

## 2012-07-17 NOTE — Assessment & Plan Note (Signed)
His ejection fraction normalized on most recent cardiac catheterization. Continue treatment with small dose metoprolol and lisinopril.

## 2012-07-17 NOTE — Progress Notes (Signed)
HPI  This is a 71 year old man who is here today for a followup visit. He had a non-ST elevation myocardial infarction in April. Troponin peaked at 40. He underwent cardiac catheterization which showed significant proximal LAD stenosis which was heavily calcified with moderate disease in the OM1. He had an angioplasty and drug-eluting stent placement done by Dr. Juliann Pares. The procedure was difficult due to calcifications and difficulty in fully expanding the stent.The first diagonal branch was jailed by the stent which resulted in TIMI 1 flow.  He had recurrent symptoms of chest pain and dyspnea after the procedure and subsequently underwent cardiac catheterization which showed patent LAD stent with significant ostial disease in first diagonal which was jailed by the stent. He was treated medically. Overall he has been doing well. He reports having to use nitroglycerin on an average of 2-3 times a month. These symptoms have been stable. They did not interfere with his everyday activities. He is more limited by dyspnea which is chronic and related to his lung disease and previous lung cancer.  Allergies  Allergen Reactions  . Aspirin   . Levofloxacin      Current Outpatient Prescriptions on File Prior to Visit  Medication Sig Dispense Refill  . aspirin 81 MG tablet Take 81 mg by mouth at bedtime.      Marland Kitchen atorvastatin (LIPITOR) 40 MG tablet Take 1 tablet (40 mg total) by mouth daily.  90 tablet  3  . calcium carbonate (TUMS - DOSED IN MG ELEMENTAL CALCIUM) 500 MG chewable tablet Chew 2 tablets by mouth daily.       . Cholecalciferol (VITAMIN D3) 1000 UNITS CAPS Take 1,000 Units by mouth daily.      . clopidogrel (PLAVIX) 75 MG tablet Take 1 tablet (75 mg total) by mouth daily.  90 tablet  3  . Cyanocobalamin (VITAMIN B-12 CR PO) Take by mouth.      Marland Kitchen lisinopril (PRINIVIL,ZESTRIL) 5 MG tablet Take 5 mg by mouth daily.      . metoprolol tartrate (LOPRESSOR) 25 MG tablet Take 1 tablet (25 mg  total) by mouth 2 (two) times daily.  90 tablet  3  . nitroGLYCERIN (NITROSTAT) 0.4 MG SL tablet Place 0.4 mg under the tongue every 5 (five) minutes as needed.      Marland Kitchen SPIRIVA HANDIHALER 18 MCG inhalation capsule daily.          Past Medical History  Diagnosis Date  . COPD (chronic obstructive pulmonary disease)   . Abscess of lateral pharyngeal space 2010  . Ex-smoker   . Acute MI 2013  . H/O: lung cancer     in remission  . 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 Hyperlipidemia   . Cardiomyopathy     EF; 40-45%  . Coronary artery disease 10/2011    NSTEMI. Cardiac cath: 80% proximal LAD stenosis (heavily calcifed), 60% Om1, EF 40-45%. Proximal LAD PCI and DES placement. Jailed first diagonal with TIMI 1 flow. repeat cath 06/13: patent stent     Past Surgical History  Procedure Date  . Cataract extraction   . Hemorrhoid surgery   . Cosmetic surgery     right hand for trauma.  . Cardiac catheterization 12/2011    [facility]  . Coronary angioplasty 04/13    Proximal LAD. 3.0 X23 mm Promus DES (Done by Dr. Juliann Pares), Jailed D1 with TIMI 1 flow     History reviewed. No pertinent family history.  History   Social History  . Marital Status: Married    Spouse Name: N/A    Number of Children: N/A  . Years of Education: N/A   Occupational History  . Not on file.   Social History Main Topics  . Smoking status: Former Smoker -- 2.0 packs/day for 45 years    Types: Cigarettes    Quit date: 11/04/2007  . Smokeless tobacco: Not on file  . Alcohol Use: 1.2 oz/week    2 Cans of beer per week  . Drug Use: No  . Sexually Active:    Other Topics Concern  . Not on file   Social History Narrative  . No narrative on file     PHYSICAL EXAM   BP 102/74  Pulse 85  Ht 6\' 2"  (1.88 m)  Wt 210 lb (95.255 kg)  BMI 26.96 kg/m2 Constitutional: He is oriented to person, place, and time. He appears well-developed and well-nourished. No distress.    HENT: No nasal discharge.  Head: Normocephalic and atraumatic.  Eyes: Pupils are equal and round. Right eye exhibits no discharge. Left eye exhibits no discharge.  Neck: Normal range of motion. Neck supple. No JVD present. No thyromegaly present.  Cardiovascular: Normal rate, regular rhythm, normal heart sounds and. Exam reveals no gallop and no friction rub. No murmur heard.  Pulmonary/Chest: Effort normal and breath sounds normal. No stridor. No respiratory distress. He has no wheezes. He has no rales. He exhibits no tenderness.  Abdominal: Soft. Bowel sounds are normal. He exhibits no distension. There is no tenderness. There is no rebound and no guarding.  Musculoskeletal: Normal range of motion. He exhibits no edema and no tenderness.  Neurological: He is alert and oriented to person, place, and time. Coordination normal.  Skin: Skin is warm and dry. Bruising and upper extremities noted. He is not diaphoretic. No erythema. No pallor.  Psychiatric: He has a normal mood and affect. His behavior is normal. Judgment and thought content normal.      EKG: Sinus  Rhythm  Low voltage in limb leads.   -Inferior ST-elevation -repolarization variant.   ABNORMAL    ASSESSMENT AND PLAN

## 2012-07-17 NOTE — Assessment & Plan Note (Signed)
Lab Results  Component Value Date   HDL 63 11/29/2011   LDLCALC 84 11/29/2011   TRIG 127 11/29/2011   CHOLHDL 2.7 11/29/2011   Continue treatment with atorvastatin 40 mg daily.

## 2012-07-17 NOTE — Assessment & Plan Note (Signed)
The patient overall seems to be doing well. He has stable symptoms of chest pain with no worsening. He continues to have easy bruising. I recommend continuing dual antiplatelet therapy for at least until April of 2014.  Continue other medications. I offered to add a long-acting daily nitroglycerin but the patient reports that his symptoms are overall mild and prefers not to make any changes. He is planning to move to [place]. I advised him to establish with a cardiologist there.

## 2012-07-17 NOTE — Patient Instructions (Addendum)
Continue same medications.

## 2012-07-31 ENCOUNTER — Ambulatory Visit: Payer: Self-pay | Admitting: Internal Medicine

## 2012-07-31 LAB — COMPREHENSIVE METABOLIC PANEL
Albumin: 4.1 g/dL (ref 3.4–5.0)
Alkaline Phosphatase: 88 U/L (ref 50–136)
Anion Gap: 10 (ref 7–16)
BUN: 14 mg/dL (ref 7–18)
Bilirubin,Total: 1.2 mg/dL — ABNORMAL HIGH (ref 0.2–1.0)
Calcium, Total: 9.4 mg/dL (ref 8.5–10.1)
Chloride: 102 mmol/L (ref 98–107)
Co2: 29 mmol/L (ref 21–32)
EGFR (Non-African Amer.): >60
Glucose: 105 mg/dL — ABNORMAL HIGH (ref 65–99)
Osmolality: 282 (ref 275–301)
SGOT(AST): 27 U/L (ref 15–37)
Sodium: 141 mmol/L (ref 136–145)
Total Protein: 7.4 g/dL (ref 6.4–8.2)

## 2012-07-31 LAB — CBC WITH DIFFERENTIAL/PLATELET
Basophil #: 0.1 10*3/uL (ref 0.0–0.1)
Basophil %: 1.3 %
Eosinophil #: 0.1 10*3/uL (ref 0.0–0.7)
Eosinophil %: 1.7 %
HCT: 45.7 % (ref 40.0–52.0)
HGB: 15.5 g/dL (ref 13.0–18.0)
Lymphocyte #: 0.9 10*3/uL — ABNORMAL LOW (ref 1.0–3.6)
MCV: 93 fL (ref 80–100)
Monocyte #: 0.5 x10 3/mm (ref 0.2–1.0)
Monocyte %: 7.8 %
Neutrophil %: 75.2 %
RDW: 13.6 % (ref 11.5–14.5)
WBC: 6.7 10*3/uL (ref 3.8–10.6)

## 2012-07-31 LAB — CK TOTAL AND CKMB (NOT AT ARMC)
CK, Total: 57 U/L (ref 35–232)
CK-MB: 1.5 ng/mL (ref 0.5–3.6)

## 2012-07-31 LAB — TROPONIN I: Troponin-I: <0.02 ng/mL

## 2012-10-08 ENCOUNTER — Other Ambulatory Visit: Payer: Self-pay

## 2012-10-08 MED ORDER — NITROGLYCERIN 0.4 MG SL SUBL: 0.4000 mg | SUBLINGUAL_TABLET | SUBLINGUAL | Status: AC | PRN

## 2012-10-08 MED ORDER — METOPROLOL TARTRATE 25 MG PO TABS: 25.0000 mg | ORAL_TABLET | Freq: Two times a day (BID) | ORAL | Status: AC

## 2012-10-08 MED ORDER — LISINOPRIL 5 MG PO TABS: 5.0000 mg | ORAL_TABLET | Freq: Every day | ORAL | Status: AC

## 2012-10-08 MED ORDER — CLOPIDOGREL BISULFATE 75 MG PO TABS: 75.0000 mg | ORAL_TABLET | Freq: Every day | ORAL | Status: AC

## 2012-11-10 IMAGING — CR DG CHEST 2V
1 series · 2 of 2 positions shown · non-contrast
Comparison: none

REASON FOR EXAM: chest pain
COMMENTS:

PROCEDURE:     DXR - DXR CHEST PA (OR AP) AND LATERAL  - December 26, 2011  [DATE]
RESULT:     Comparison: 10/26/2011

[Series 1: w chest pa · 0.14mm/px · 2 of 2 slices shown]
[im 1/2]
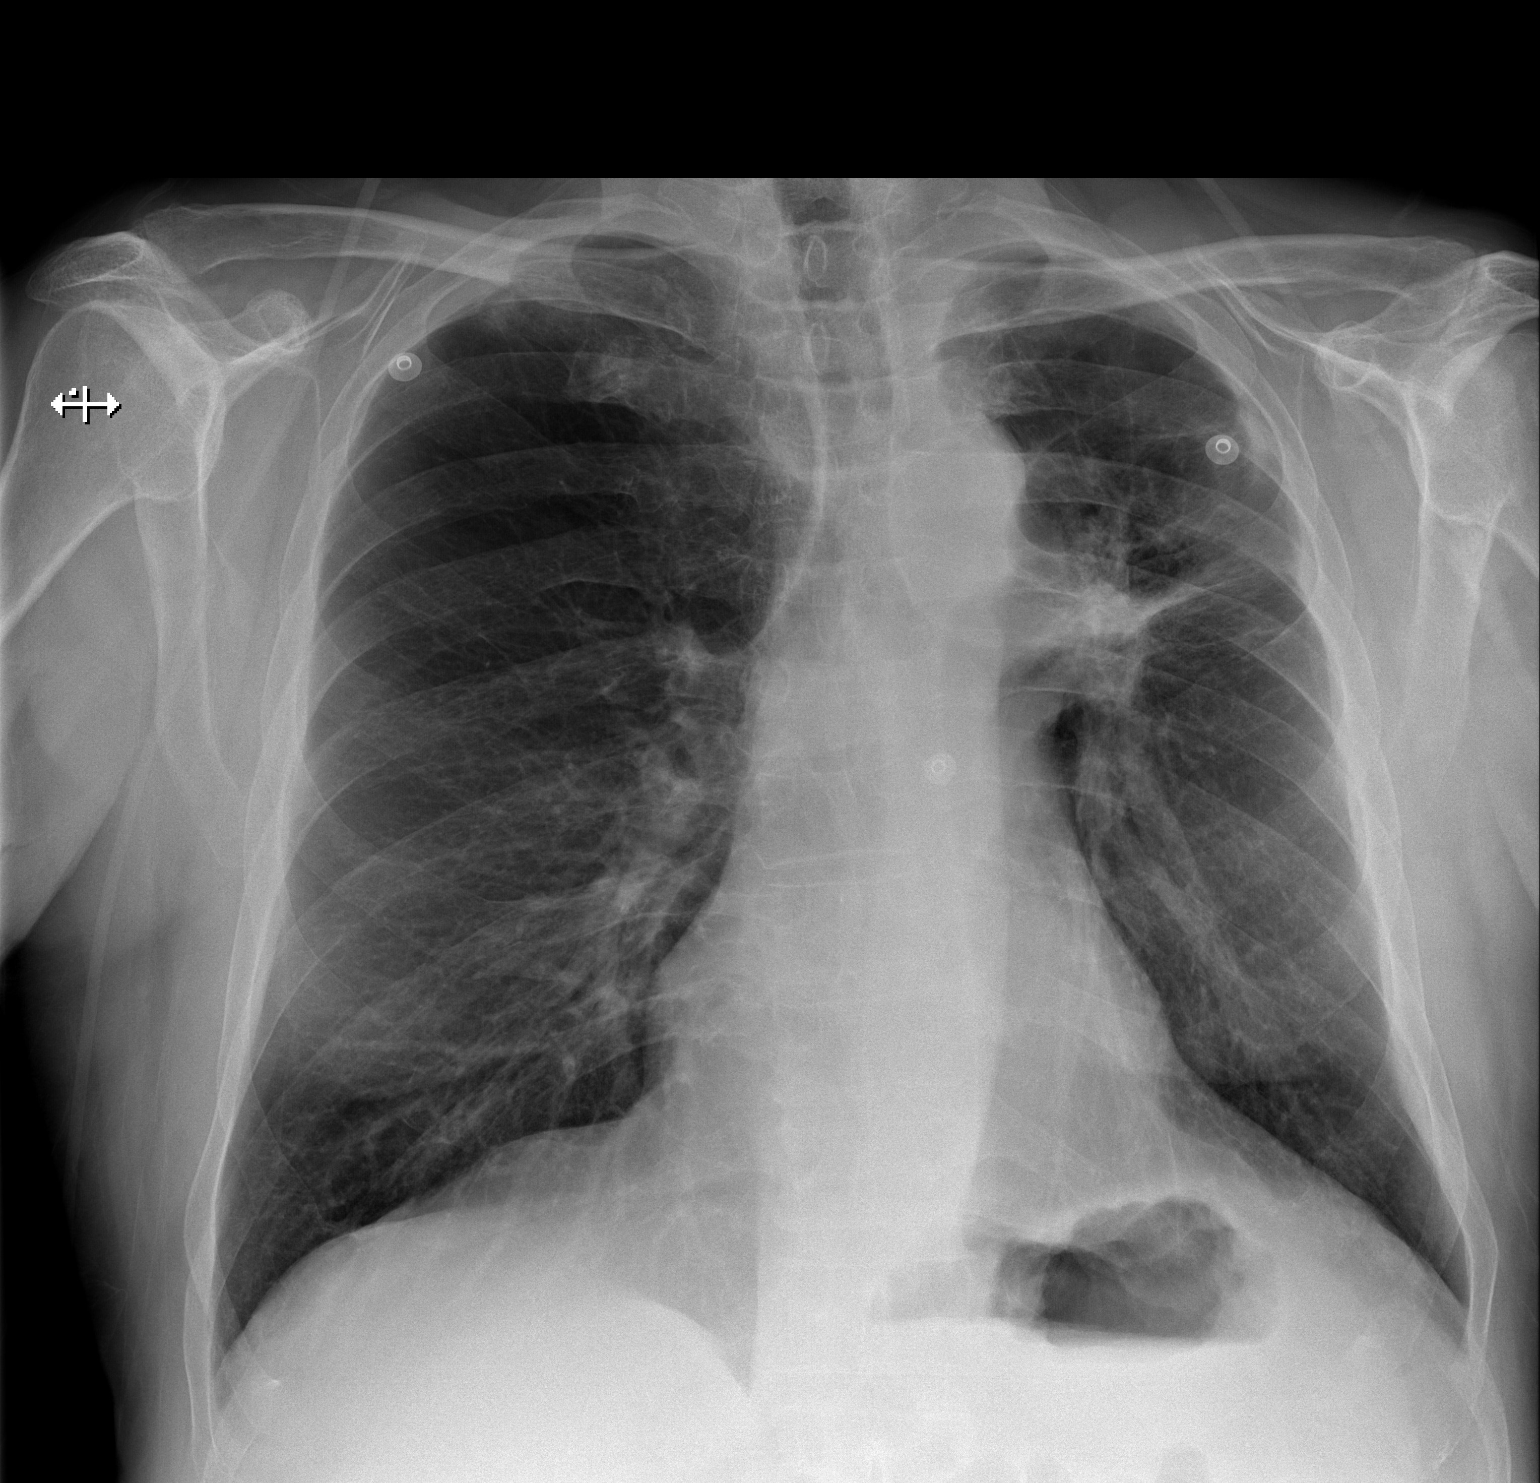
[im 2/2]
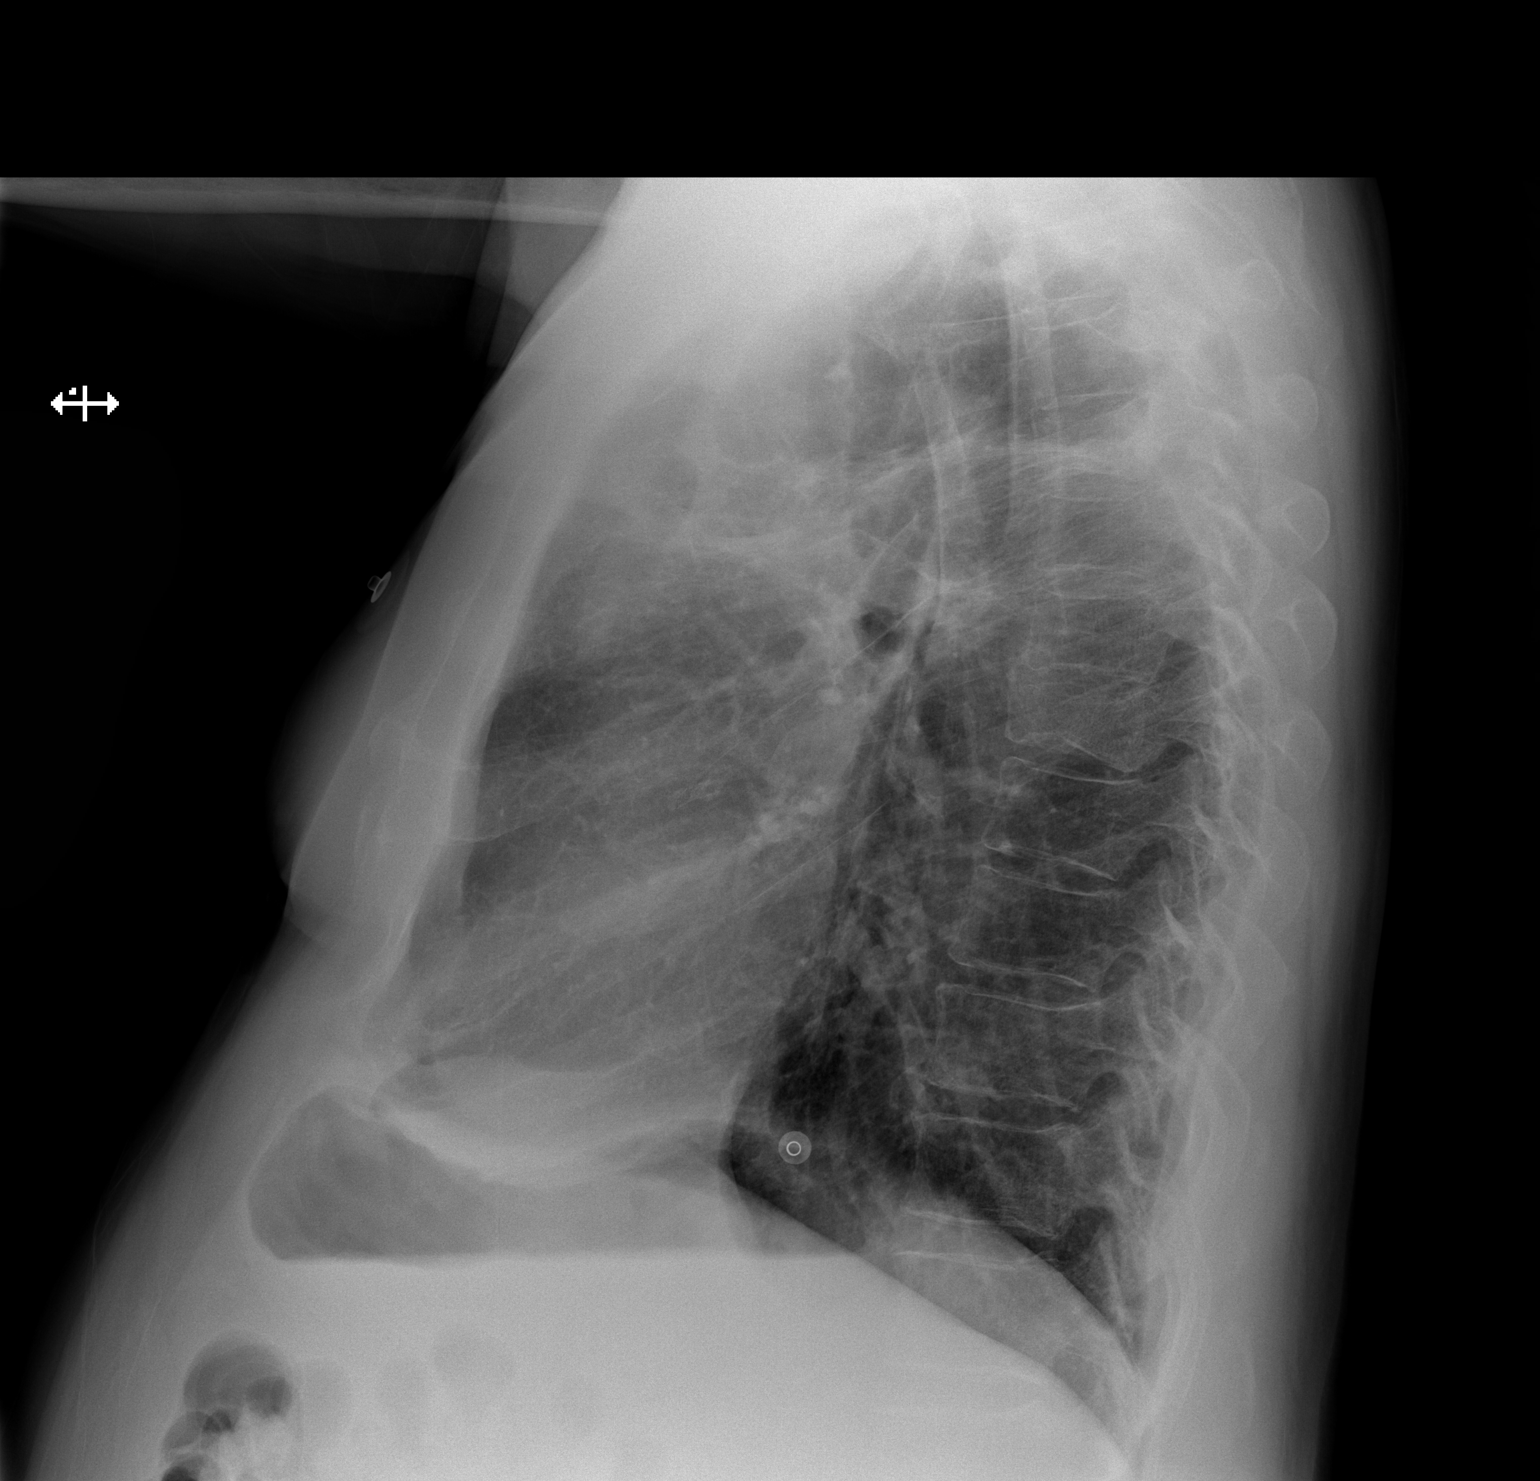

[2 of 2 positions shown; findings below may reference images not displayed]

FINDINGS: PA and lateral chest radiographs are provided. There is a left suprahilar
spiculated opacity which may represent post treatment changes when compared
with prior examinations. Residual or recurrent malignancy cannot be
excluded. There is no other focal parenchymal opacity, pleural effusion, or
pneumothorax. The heart and mediastinum are unremarkable.  The osseous
structures are unremarkable.
IMPRESSION: No acute disease of the che[REDACTED]

## 2013-06-16 IMAGING — CT CT CHEST W/ CM
1 of 2 series · 14 of 32 positions shown, 18 images · IV contrast (agent unspecified)
Comparison: none

REASON FOR EXAM: Call report  [id number] SOB anxiety tachycardia hist
lung CA
COMMENTS:

PROCEDURE:     CT  - CT CHEST WITH CONTRAST  - July 31, 2012 [DATE]
RESULT:     History: Shortness of breath. Tachycardia.
Comparison Study: Prior CT of 12/26/2011.

[Series 5: lung windows · axial · 0.83mm/px · z∈[-404,-124]mm · 14 of 111 slices shown, 18 images]
[im 9/111  mediastinal]
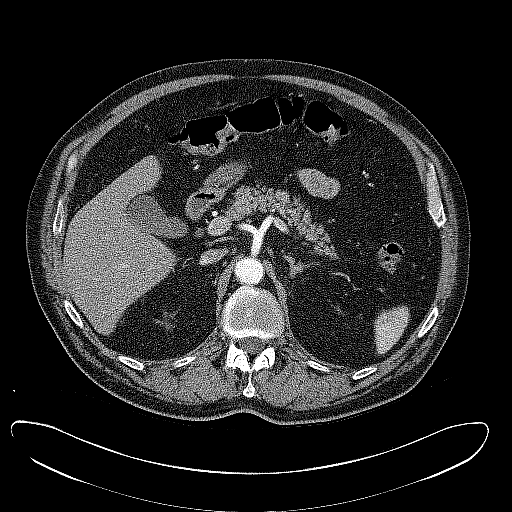
[im 9/111  lung]
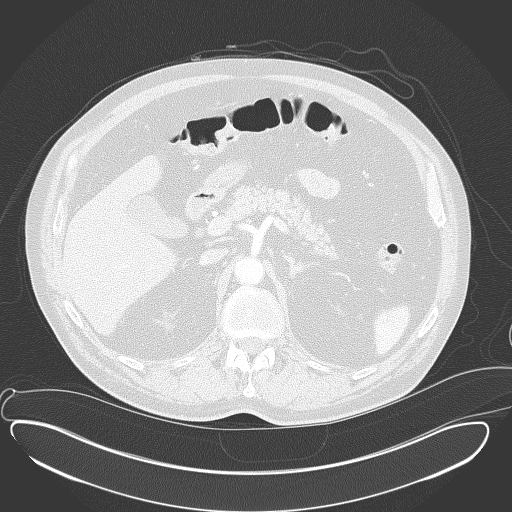
[im 17/111  lung]
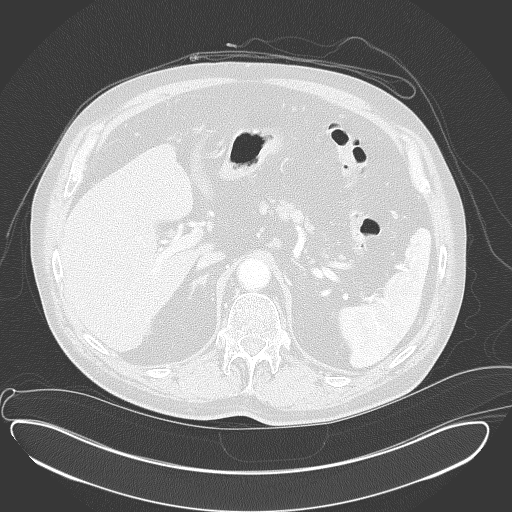
[im 26/111  lung]
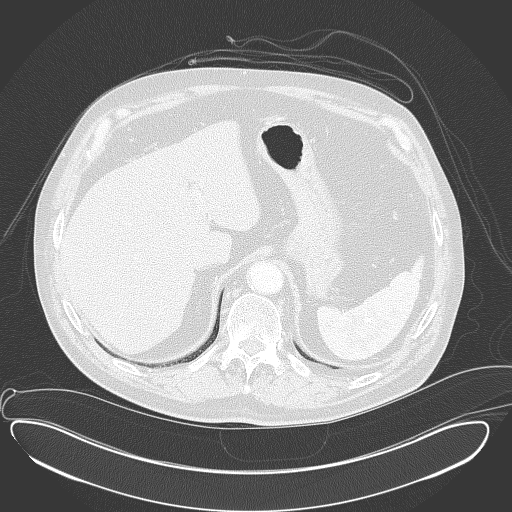
[im 34/111  lung]
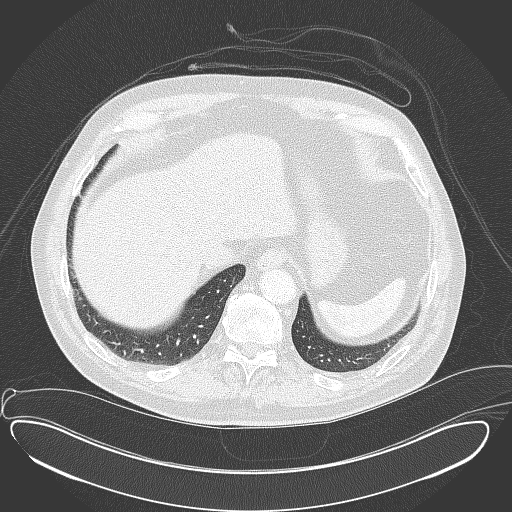
[im 43/111  mediastinal]
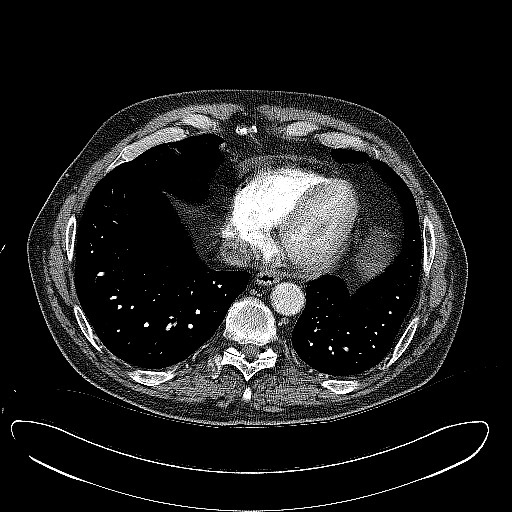
[im 43/111  lung]
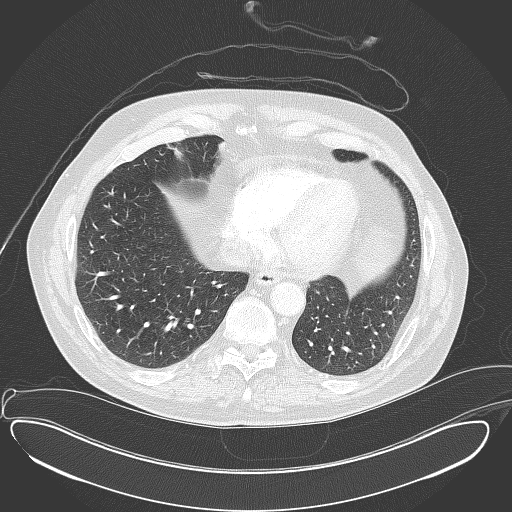
[im 51/111  lung]
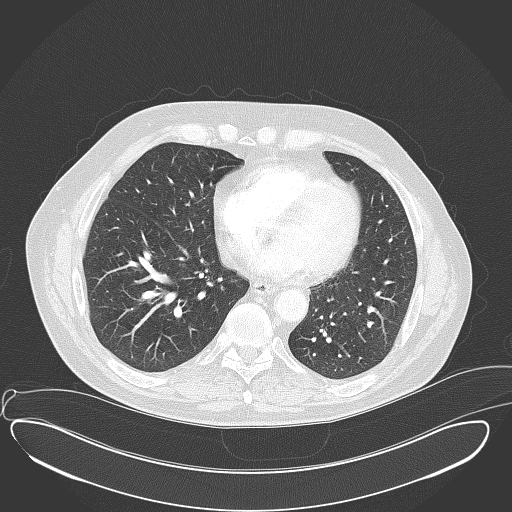
[im 52/111  lung]
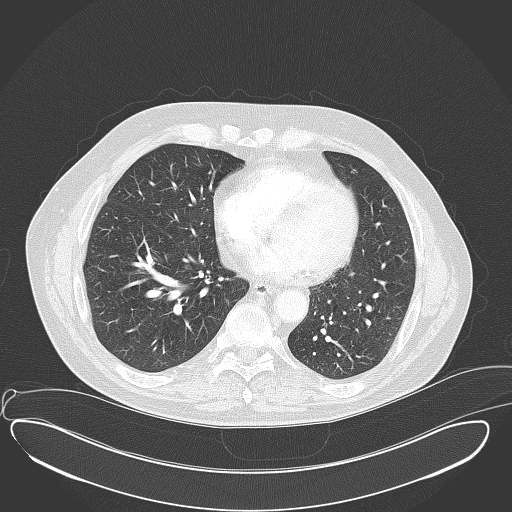
[im 56/111  lung]
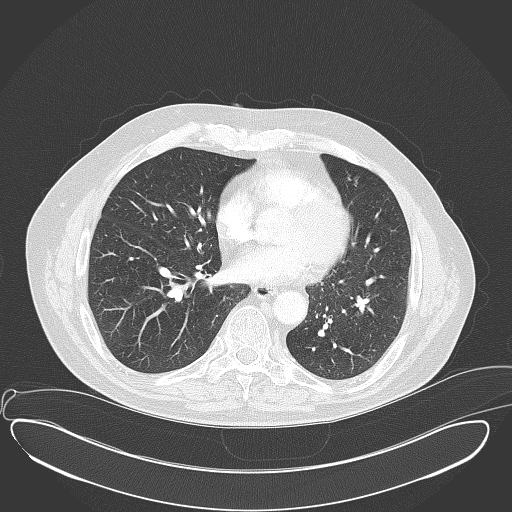
[im 60/111  mediastinal]
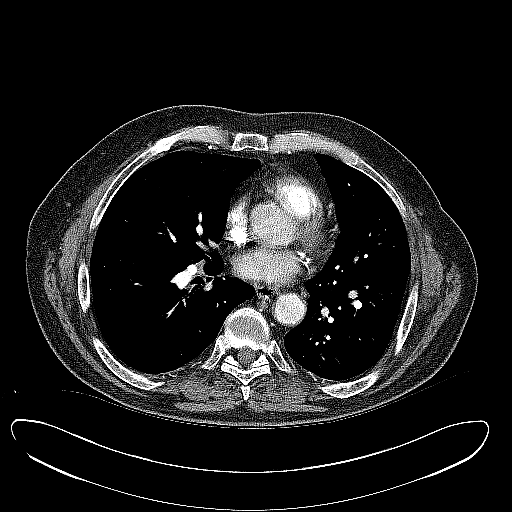
[im 60/111  lung]
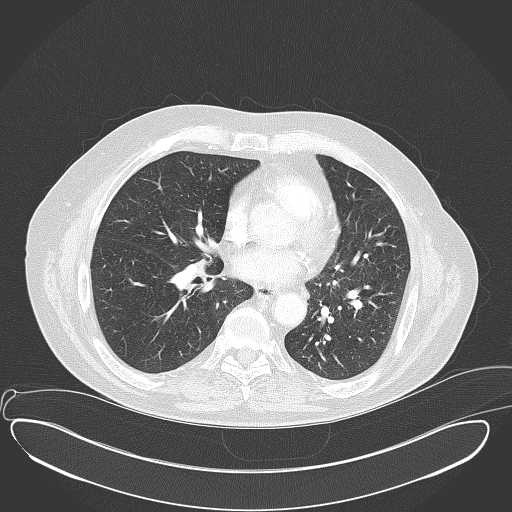
[im 68/111  lung]
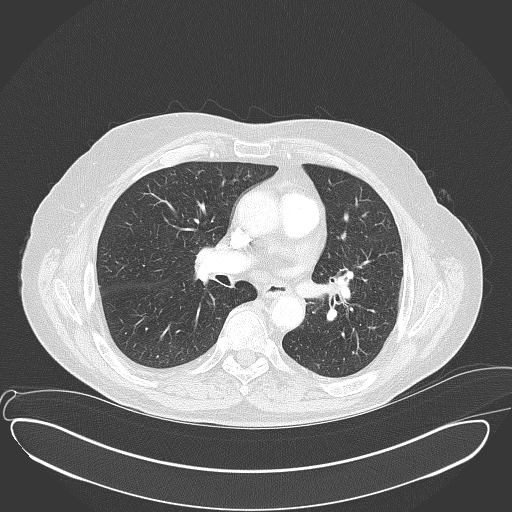
[im 77/111  lung]
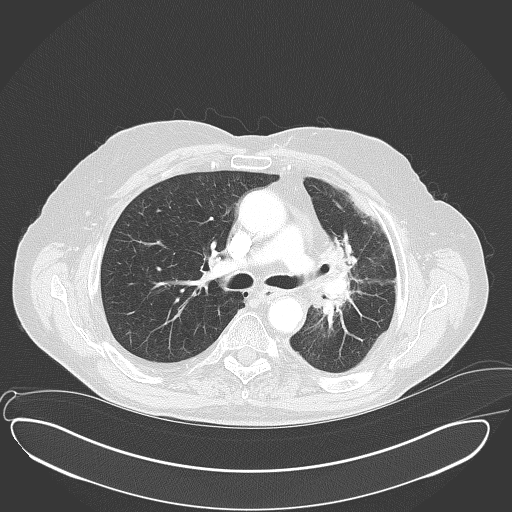
[im 85/111  lung]
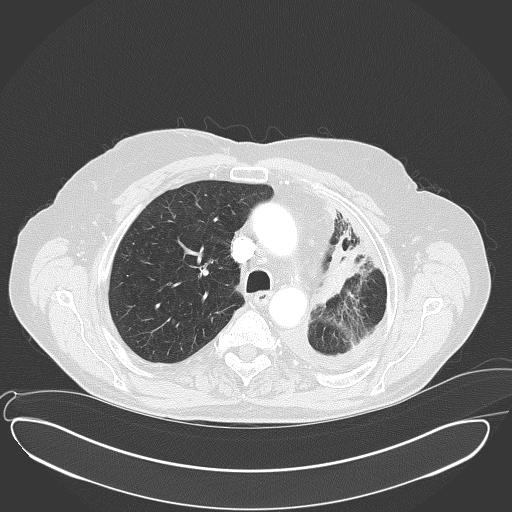
[im 94/111  mediastinal]
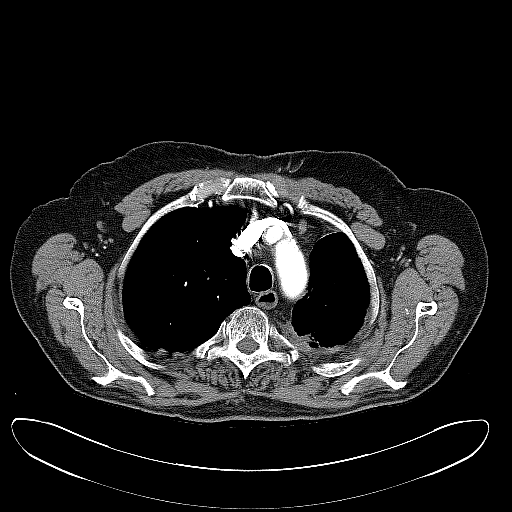
[im 94/111  lung]
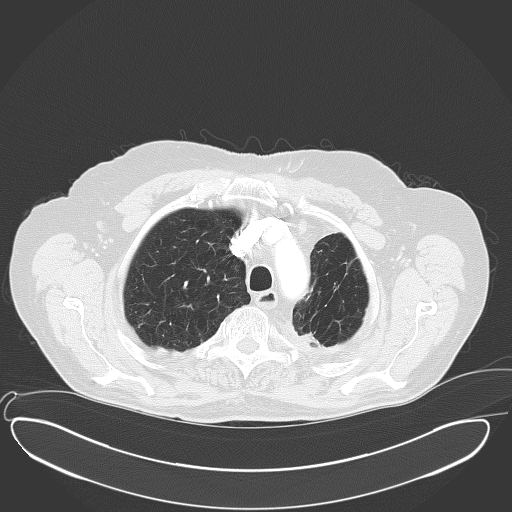
[im 102/111  lung]
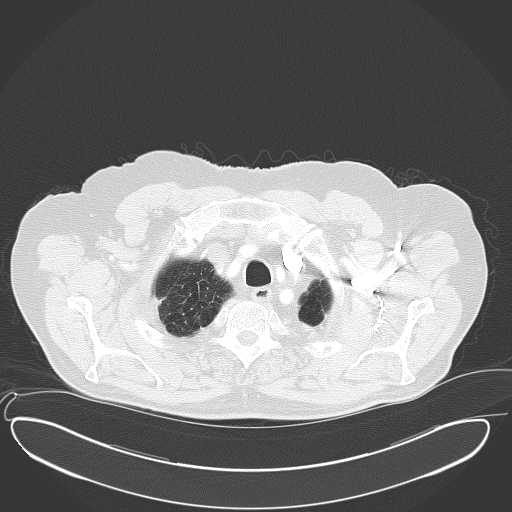

[14 of 32 positions shown; findings below may reference images not displayed]

FINDINGS: Standard CT obtained with 100 cc of Nsovue-D8M. The thoracic aorta
unremarkable. Adrenals unremarkable. Pulmonary arteries are unremarkable.
Atelectasis and soft tissue fullness in the left perihilar region present.
This is unchanged from 12/26/2011. These findings are consistent with the
patient's history of lung cancer/ radiation therapy. Large airways are
patent. Right apical pleural parenchymal thickening is stable most
consistent with scarring. Tiny stable nonspecific bilateral pulmonary
nodules are present. Stable tiny pericardial effusion. Coronary artery
calcination. Heart size normal.
Sclerotic compression fractures are noted in the upper and mid thoracic
spine. This could represent metastatic disease. Bone scan whole-body can be
obtained for further evaluation.
IMPRESSION: 1. No evidence of pulmonary embolus.
2. Left perihilar/ left upper lobe changes consistent with scarring and/or
atelectasis/lung cancer/post radiation change. Tiny nonspecific 2 mm
pulmonary nodules present and are stable. Right apical pleural-parenchymal
thickening is stable. Continued followup chest CTs to demonstrate stability
suggested.
3. Sclerotic compression fractures in thoracic spine. This could be
secondary to metastatic disease. Whole-body bone scan should be considered
for further evaluation.

## 2014-11-09 NOTE — Discharge Summary (Signed)
PATIENT NAME:  Alexander [name], Alexander [name] MR#:  [id number] DATE OF BIRTH:  1941-06-18  DATE OF ADMISSION:  10/26/2011 DATE OF DISCHARGE:  10/28/2011  DIAGNOSES:  1. NSTEMI status post cath and DES placement to LAD. 2. 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 3. Chronic obstructive pulmonary disease.   DISPOSITION: The patient is being discharged home.   FOLLOW-UP: Follow-up with Dr. Mariah MillingGollan and PCP, Dr. Judithann GravesBerglund, in 1 to 2 weeks after discharge.   DIET: Low sodium.   ACTIVITY: As tolerated. No exertional activity.   DISCHARGE MEDICATIONS:  1. Lisinopril 5 mg daily.  2. Lopressor 25 mg b.i.[name].  3. Nitroglycerin 0.4 mg sublingual q.5 minutes x3 p.r.n. chest pain.  4. Aspirin 325 mg daily.  5. Plavix 75 mg daily.  6. Lipitor 40 mg daily.  7. Spiriva 18 mcg inhaled daily.  8. Vitamin B12 once a day.  9. Vitamin [name] 1000 international units once a day. 10. TUMS 2 tablets b.i.[name].   CONSULTATION: Cardiology consultation with Dr. Mariah MillingGollan   PROCEDURE: Catheterization with DES placement to LAD.   LABORATORY AND RADIOLOGICAL DATA: CT of the chest showed no evidence of PE. Upper lobe findings suggestive of prior radiation therapy, recurrent or residual malignancy cannot be excluded. The patient had a NSTEMI with troponin more than 40. LDL 129, VLDL 20, cholesterol 203, triglycerides 101, HDL 54. Hemoglobin A1c 5.5. CBC essentially normal.   HOSPITAL COURSE: The patient is a 74 year old male with past medical history of extensive smoking who has currently quit, history of lung cancer in remission, and chronic obstructive pulmonary disease who presented with chest pain. He was found to have NSTEMI with troponins of more than 40. The patient underwent a catheterization during the hospitalization with stent placement to LAD. He was on Integrilin for 24 hours after the catheterization because the lesion in the LAD was severe and very calcified. He had some ooze after the catheterization. He  had some oozing from the groin which subsequently subsided. By the time of discharge the patient was chest pain free and ambulating without any difficulty. He is being discharged home on aspirin, Plavix, beta-blocker, statin, and p.r.n. nitroglycerin. His LDL is elevated. He has a history of lung cancer which is currently in remission. His COPD remained stable during the hospitalization. The patient will follow-up with Dr. Mariah MillingGollan as an outpatient.   TIME SPENT: 45 minutes.   ____________________________ Darrick MeigsSangeeta Kayleb Warshaw, MD sp:drc [name]: 10/28/2011 18:33:18 ET T: 10/31/2011 10:27:02 ET JOB#: [id number]  cc: Darrick MeigsSangeeta Joren Rehm, MD, <Dictator> Darrick MeigsSANGEETA Harini Dearmond MD ELECTRONICALLY SIGNED 11/05/2011 7:17

## 2014-11-09 NOTE — Discharge Summary (Signed)
PATIENT NAME:  Alexander [name], Alexander [name] MR#:  [id number] DATE OF BIRTH:  [birth year]  DATE OF ADMISSION:  12/27/2011 DATE OF DISCHARGE:  12/27/2011  ADMISSION DIAGNOSES:  1. Chest pain. 2. Shortness of breath.  3. History of coronary artery disease.    CONSULT: Dr. Mariah MillingGollan.   LABORATORY, DIAGNOSTIC AND RADIOLOGICAL DATA: Patient underwent a cardiac catheterization on 12/27/2011. Moderate to severe ostial diagonal disease at the site of the LAD stent. Normal ejection fraction estimated at greater than 55%. No significant AS or MI. Patent proximal LAD stent. Mild luminal irregularities.  Troponins x3 were negative.   HOSPITAL COURSE: 74 year old male with known LAD stent who presented with chest pain. For further details, please refer to the history and physical.  1. Chest pain likely secondary to angina. Patient had a recent stent to the LAD in mid April. He underwent a cardiac catheterization during this hospitalization with results above. His chest pain was likely related to angina. There is no acute findings. There is no stent restenosis, which we were concerned about. Will continue medical management.  2. Shortness of breath. CT of the chest showed no PE but he has scarring in the left upper lobe. It is probably related to his angina.  3. Hyperlipidemia. Patient is on atorvastatin.  4. History of coronary artery disease: Patient will resume his outpatient medications.   DISCHARGE MEDICATIONS:  1. Vitamin D3 1000 international units daily.  2. Spiriva 18 mcg daily.  3. Metoprolol 25 b.i.[name].  4. Nitrostat 0.4 mg every five minutes p.r.n. chest pain.  5. Plavix 75 mg daily.  6. Atorvastatin 40 mg at bedtime.  7. Aspirin 81 mg daily.  8. Vitamin B12 1000 mcg daily.  9. Spiriva 18 mcg daily.   DISCHARGE DIET: Low sodium.   DISCHARGE ACTIVITY: No exertion or heavy lifting for one week.   DISCHARGE FOLLOW UP: Patient can follow up with Dr. Kirke CorinArida and Dr. Judithann GravesBerglund in one week.   TIME SPENT: 35  minutes.   ____________________________ Janyth ContesSital P. Juliene PinaMody, MD spm:cms [name]: 12/28/2011 13:55:53 ET T: 12/29/2011 10:29:03 ET JOB#: [id number]  cc: Maveric Debono P. Juliene PinaMody, MD, <Dictator> Bari EdwardLaura Berglund, MD Chelsea AusMuhammad A. Kirke CorinArida, MD Janyth ContesSITAL P Alaijah Gibler MD ELECTRONICALLY SIGNED 12/29/2011 12:44

## 2014-11-09 NOTE — Consult Note (Signed)
PATIENT NAME:  Alexander [name], Alexander [name] MR#:  [id number] DATE OF BIRTH:  [birth year]  DATE OF CONSULTATION:  12/27/2011  REFERRING PHYSICIAN:  Dr. Renae GlossWieting  CONSULTING PHYSICIAN:  Jerolyn CenterMuhammad A. Kirke CorinArida, MD  REASON FOR CONSULTATION: Chest pain.   HISTORY OF PRESENT ILLNESS: This is a 74 year old gentleman who is well known to me. He had non-ST elevation myocardial infarction in April of this year. His troponin peaked at 40. He underwent cardiac catheterization at that time which showed significant proximal LAD stenosis which was heavily calcified with moderate 60% disease in OM1. He underwent an angioplasty and drug-eluting stent placement to the proximal LAD, which was a difficult procedure due to heavy calcifications and difficulty in fully expanding the stent. The first diagonal was jailed which resulted in TIMI-1 flow. Since that time, the patient has been seen multiple times in the outpatient setting with recurrent symptoms of chest discomfort and exertional dyspnea. His medications were adjusted and optimized. He was also referred to cardiac rehab. He does have significant lung disease and previous history of lung cancer, status post resection. He underwent recent treadmill stress test in the office which was limited by severe exertional dyspnea which prompted stopping the stress test prematurely. He presented to the Emergency Room due to an episode of substernal chest discomfort at rest. He also had palpitations and his heart rate was 120 beats per minute. This happened after he was walking out of cardiac rehab from a teaching session. He actually did not exercise. He did not feel well after that for about an hour. He is currently chest pain free. His cardiac enzymes are negative.   PAST MEDICAL HISTORY:  1. Coronary artery disease as outlined above status post drug-eluting stent placement to the proximal LAD.  2. History of lung cancer, status post resection and chemotherapy.  3. Chronic obstructive  pulmonary disease.  4. Hyperlipidemia.  5. Hypertension.   HOME MEDICATIONS:  1. Aspirin 81 mg daily.  2. Plavix 75 mg daily.  3. Atorvastatin 40 mg daily.  4. Metoprolol 25 mg twice daily.  5. Sublingual nitroglycerin as needed.  6. Spiriva 1 puff daily.  7. TUMS 2 tablets twice daily.  8. Vitamin D3 and vitamin B12.   ALLERGIES: He has intolerance to aspirin but not a true allergy. He is allergic to Levaquin.   SOCIAL HISTORY: He quit smoking about four years ago. He has a 40 pack-year history of smoking. He drinks 3 to 4 beers daily and denies recreational drug use. He is married and lives with his wife.   FAMILY HISTORY: Negative for premature coronary artery disease.   REVIEW OF SYSTEMS: A 10 point review of systems was performed. It is negative other than as mentioned in the history of present illness.   PHYSICAL EXAMINATION:  VITAL SIGNS: Temperature 98.0, pulse 85, respiratory rate 18, blood pressure 133/81, oxygen saturation 96% on room air.   GENERAL: The patient is pleasant, appears to be at his stated age and in no acute distress.   HEENT: Normocephalic, atraumatic.   NECK: No JVD or carotid bruits.   RESPIRATORY: Normal respiratory effort with diminished breath sounds bilaterally. No crackles or wheezing.   CARDIOVASCULAR: Normal PMI. Normal S1 and S2 with no gallops or murmurs.   ABDOMEN: Benign, nontender, nondistended.   EXTREMITIES: No clubbing, cyanosis, or edema.   SKIN: Warm and dry with no rash.   PSYCHIATRIC: He is alert, oriented x3 with normal mood and affect.   LABORATORY, DIAGNOSTIC, AND RADIOLOGICAL DATA: His  renal function is normal. Cardiac enzymes are negative. Troponin was borderline elevated. CT scan of the chest showed no evidence of pulmonary embolism.   IMPRESSION:  1. Chest pain, possibly due to unstable angina.  2. Known history of coronary artery disease status post recent non-ST elevation myocardial infarction as well as  drug-eluting stent placement to the proximal LAD with residual 60% stenosis in OM1.  3. Significant exertional dyspnea, could be angina equivalent versus due to his known lung disease.  4. Hypertension, currently well controlled.  5. Hyperlipidemia.   RECOMMENDATIONS: The patient's current symptoms are worrisome for possible unstable angina. He had recurrent chest pain and exertional dyspnea since his LAD stent in spite of optimizing his medical therapy. Due to all of that and difficulty in placing his LAD stent, I recommend proceeding with cardiac catheterization to re-evaluate for possible aggressive restenosis. Risks, benefits and alternatives were discussed with the patient. Continue aggressive medical therapy. Further recommendations to follow after cardiac catheterization.   ____________________________ Chelsea Aus Kirke Corin, MD maa:cms [name]: 12/27/2011 10:18:38 ET T: 12/27/2011 12:59:38 ET JOB#: [id number]  cc: Vinnie Bobst A. Kirke Corin, MD, <Dictator>  Iran Ouch MD ELECTRONICALLY SIGNED 01/03/2012 11:00

## 2014-11-09 NOTE — Consult Note (Signed)
Brief Consult Note: Diagnosis: chest pain possible unstable.   Patient was seen by consultant.   Consult note dictated.   Comments: known CAD with NSTEMI in 10/2011 with LAD PCI and DES. Recurrent chest pain and exertional dyspnea since then.  Recommend cardiac cath today.  Electronic Signatures: Lorine BearsArida, Muhammad (MD)  (Signed 11-Jun-13 07:57)  Authored: Brief Consult Note   Last Updated: 11-Jun-13 07:57 by Lorine BearsArida, Muhammad (MD)

## 2014-11-09 NOTE — H&P (Signed)
PATIENT NAME:  Alexander [name], Alexander [name] MR#:  [id number] DATE OF BIRTH:  10-Apr-1941  DATE OF ADMISSION:  10/26/2011  ADMITTING PHYSICIAN: Dr. Enid Baas  PRIMARY CARE PHYSICIAN: Dr. Judithann Graves in [facility] PRIMARY ONCOLOGIST: Dr. Doylene Canning  CHIEF COMPLAINT: Chest pain.   HISTORY OF PRESENT ILLNESS: Mr. 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 Doylene Canning, status post chemoradiation currently in remission comes to the hospital complaining of chest pressure that started this morning. Patient denies any prior cardiac history or prior episodes of chest pain. Recently was in urgent care clinic for palpitations about a month ago when his heart rate went up as high as 110 but normalized; thought secondary to Spiriva and was discharged home. He said he was relaxing this morning watching TV and all of a sudden felt tight discomfort in the chest, very excruciating, crushing kind of pain and also noticed some tingling of the left hand fingers. His wife drove him to the hospital. His first set of cardiac enzymes were negative. He did have a CT of the chest to rule out PE which was negative but his second set off troponin went as high as 30. His initial EKG shows some ST elevation noted in lead V1 but the repeat EKG showed it improved. He is currently chest pain free and is being admitted for non-ST segment elevation myocardial infarction.   PAST MEDICAL HISTORY:  1. 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  2. Chronic obstructive pulmonary disease.  3. Ex-smoker.   PAST SURGICAL HISTORY:  1. Hypopharyngeal abscess surgical resection in 2010. 2. Cataract surgery.  3. Hemorrhoid surgery.  4. Plastic surgery on his right hand for trauma.   ALLERGIES TO  MEDICATIONS: Levaquin.   CURRENT MEDICATIONS AT HOME:  1. Spiriva inhaler daily.  2. Vitamin B12 tablet 1 tablet daily.  3. Vitamin D3 1000 international units daily.  4. TUMS 2 tablets p.o. b.i.d.   SOCIAL HISTORY: Lives at home with wife. Quit smoking about 3 to 4 years ago; prior to that used to smoke about 2 packs per day. Drinks beer, at least 3 to 4 every day.   FAMILY HISTORY: Parents were separated, does not know much about his dad or his side of the family. Dad died age 43s with heart problems. Mom passed away in her 68s, had pancreatic cancer. Everybody on mom's side of the family had heart valve disease and requiring replacement.   REVIEW OF SYSTEMS: CONSTITUTIONAL: No fatigue, weakness, pain, or weight loss. EYES: No blurred vision, double vision. Uses reading glasses and had cataract taken out. ENT: No tinnitus, ear pain, hearing loss, epistaxis or discharge. RESPIRATORY: No cough, wheeze, hemoptysis, or asthma. CARDIOVASCULAR: Positive for chest pain. No orthopnea, edema, arrhythmia, palpitations, or syncope. GASTROINTESTINAL: No nausea, vomiting, diarrhea, abdominal pain, hematemesis, or melena. GENITOURINARY: No dysuria, hematuria, renal calculus, frequency, or incontinence. ENDOCRINE: No polyuria, nocturia, thyroid problems, heat or cold intolerance. HEMATOLOGY: No anemia, easy bruising or bleeding. SKIN: No acne, rash, or lesions. MUSCULOSKELETAL: No neck, back, shoulder pain, arthritis, or gout. NEUROLOGIC: No numbness, weakness, cerebrovascular accident, transient ischemic attack, or seizures. PSYCHOLOGICAL: No anxiety, insomnia, or depression.   PHYSICAL EXAMINATION:  VITAL SIGNS: Temperature 97.3 degrees Fahrenheit, pulse 86, respirations 20, blood pressure 162/90, pulse oximetry 96% on room air.  GENERAL: Well-built, well-nourished male lying in bed, not in any acute distress.   HEENT: Normocephalic, atraumatic. Pupils equal, round, reacting to light. Anicteric sclerae.  Extraocular movements intact. Oropharynx clear without any erythema, mass, or exudates.   NECK: Supple. No thyromegaly, JVD or carotid bruits. No lymphadenopathy.   LUNGS: Clear to auscultation bilaterally. No wheeze or crackles. No use of accessory muscles for breathing. Slightly decreased breath sounds in the left lung base.   CARDIOVASCULAR: S1 and S2 regular rate and rhythm. No murmurs, rubs, or gallops.   ABDOMEN: Soft, nondistended. No hepatosplenomegaly. Normal bowel sounds.   EXTREMITIES: No pedal edema. No clubbing or cyanosis. 2+ dorsalis pedis pulses palpable bilaterally.   SKIN: No acne, rash, or lesions.   NEUROLOGIC: Cranial nerves intact. No focal motor or sensory deficits.   PSYCHOLOGICAL: Patient is awake, alert, oriented x3.   LABORATORY, DIAGNOSTIC AND RADIOLOGICAL DATA:  WBC 5.6, hemoglobin 15.8, hematocrit 47.8, platelet count 169.   Sodium 139, potassium 4.0, chloride 103, bicarbonate 25, BUN 15, creatinine 0.96, glucose 114, calcium 9.3.   ALT 23, AST 99, alkaline phosphatase 77, total bilirubin 1.4, albumin 4.3, BNP 74.   First set of CK 66, CK-MB 1.3, troponin less than 0.02. Urinalysis negative for any infection. Second set of CK 966, CK-MB 127.9, troponin 30.   Chest x-ray showing stable appearing chest since January 2013. Density of the left hilar level with post surgical, postradiation changes. No bulk tumor is seen. CT of the chest showing no evidence of any PE. Pulmonary findings are significant for left upper lobe changes probably related to prior radiation. Recurrent or residual malignancy cannot be entirely excluded given that there was increased metabolic activity on the recent PET scan.  The first EKG done at 8:00 a.m. this morning had slight ST elevation changes seen in only lead V1. Repeat EKG at 12:00 noon did not show further progression.    ASSESSMENT AND PLAN: 74 year old male with history of chronic obstructive pulmonary disease, ex-smoker  and stage IIIA squamous cell carcinoma of the lung diagnosed in 03/2010, status post chemoradiation comes with chest pain and second set of enzymes being positive.  1. Non-ST segment elevation MI. No significant EKG changes. First set of enzymes are negative but second set are positive. Patient is currently chest pain free. Discussed with Dr. Mariah MillingGollan. Will admit the patient to telemetry. Will need a cardiac catheterization probably in the morning because he received aspirin and also Lovenox just now in the ED. Start nitro patch, metoprolol, lisinopril, simvastatin and also aspirin. Patient will probably get cardiac catheterization tomorrow.  2. Stage IIIA squamous cell carcinoma of the lung diagnosed in 03/2010, status post chemoradiation. Per patient he is in remission. He will follow up with Dr. Doylene Canninghoksi as recommended.  3. Chronic obstructive pulmonary disease. Continue Spiriva. Appears stable at this time.   4. GI and deep vein thrombosis prophylaxis. On Protonix and also Lovenox. 5. CODE STATUS: FULL CODE.   TIME SPENT ON ADMISSION: 50 minutes.  ____________________________ Enid Baasadhika Howie Rufus, MD rk:cms D: 10/26/2011 13:04:32 ET T: 10/26/2011 14:08:30 ET JOB#: [id number]  cc: Enid Baasadhika Arianni Gallego, MD, <Dictator> Bari EdwardLaura Berglund, MD Gerome SamJanak K. Doylene Canninghoksi, MD Enid BaasADHIKA Ari Engelbrecht MD ELECTRONICALLY SIGNED 10/26/2011 14:57

## 2014-11-09 NOTE — H&P (Signed)
PATIENT NAME:  Alexander [name], Alexander [name] MR#:  [id number] DATE OF BIRTH:  April 12, 1941  DATE OF ADMISSION:  12/26/2011  PRIMARY CARE PHYSICIAN: Bari Edward, MD   CARDIOLOGIST: Lorine Bears, MD    CHIEF COMPLAINT: Chest pain and shortness of breath.   HISTORY OF PRESENT ILLNESS: This is a 74 year old male who presents to the hospital for chest pain and shortness of breath that's been ongoing for the past week or so. The patient says the chest pain was very mild and only started today. It was very similar to when he had his heart attack and stent placement in April of this past year. During April of this past year the patient did undergo a cardiac cath which showed a fairly calcified lesion to the proximal LAD lesion which was stented. For the past week the patient has been feeling more short of breath, weak, and just not feeling overall well. He started developing some chest pain earlier today and, therefore, came to the ER for further evaluation. Hospitalist services were then contacted for further treatment and evaluation.   REVIEW OF SYSTEMS: CONSTITUTIONAL: No documented fever. No weight gain. No weight loss. EYES: No blurry or double vision. ENT: No tinnitus. No postnasal drip. No redness of the oropharynx. RESPIRATORY: No cough, no wheeze, no hemoptysis. Positive dyspnea on exertion. CARDIOVASCULAR: Positive chest pain. No orthopnea, no palpitations, no syncope. GI: No nausea, no vomiting, no diarrhea, no abdominal pain, no melena, no hematochezia. GU: No dysuria, no hematuria. ENDOCRINE: No polyuria or nocturia. No heat or cold intolerance. HEME: No anemia, no bruising, no bleeding. INTEGUMENTARY: No rashes, no lesions. MUSCULOSKELETAL: No arthritis, no swelling, no gout. NEUROLOGIC: No numbness, no tingling, no ataxia, no seizure-type activity. PSYCH: No anxiety, no insomnia, no ADD.   PAST MEDICAL HISTORY:  1. Hypertension.  2. Hyperlipidemia.  3. History of coronary artery disease, status post  stent placement.  4. History of lung cancer status post chemoradiation. 5. Chronic obstructive pulmonary disease.   ALLERGIES: He is allergic to aspirin and Levaquin but aspirin is not a true allergy; it is high risk of bleeding.   SOCIAL HISTORY: Used to be a smoker, does have a 40-pack-year smoking history, quit about four years ago. Does drink about 3 to 4 beers daily. No illicit drug abuse. Lives at home with his wife.   FAMILY HISTORY: The patient's mother died from pancreatic cancer. Father died from unknown causes.   CURRENT MEDICATIONS:  1. Aspirin 81 mg daily.  2. Plavix 75 mg daily.  3. Atorvastatin 40 mg daily.  4. Metoprolol tartrate 25 mg b.i.d.  5. Sublingual nitroglycerin as needed.  6. Spiriva 1 puff daily. 7. TUMS 2 tabs b.i.d.  8. Vitamin B12 1000 units daily.  9. Vitamin D3 1000 international units daily.   PHYSICAL EXAMINATION ON ADMISSION:   VITAL SIGNS: Temperature 98, pulse 85, respirations 18, blood pressure 133/81, sats 96% on room air.   GENERAL: He is a pleasant appearing male in no apparent distress.   HEENT: He is atraumatic, normocephalic. His extraocular muscles are intact. Pupils equal and reactive to light. Sclerae anicteric. No conjunctival injection. No oropharyngeal erythema.   NECK: Supple. No jugular venous distention, no bruits, no lymphadenopathy, no thyromegaly.   HEART: Regular rate and rhythm. No murmurs, no rubs, no clicks.   LUNGS: Clear to auscultation bilaterally. No rales, no rhonchi, no wheezes.   ABDOMEN: Soft, flat, nontender, nondistended. Has good bowel sounds. No hepatosplenomegaly appreciated.   EXTREMITIES: No evidence of any  cyanosis, clubbing, or peripheral edema. Has +2 pedal and radial pulses bilaterally.   NEUROLOGIC: The patient is alert, awake, and oriented x3 with no focal motor or sensory deficits appreciated bilaterally.   SKIN: Moist and warm with no rash appreciated.   LYMPHATIC: There is no cervical or  axillary lymphadenopathy.   LABORATORY, DIAGNOSTIC, AND RADIOLOGICAL DATA: Serum glucose 113, BUN 14, creatinine 0.9, sodium 140, potassium 3.9, chloride 105, bicarb 29. LFTs are within normal limits. Troponin less than 0.02. White cell count 8, hemoglobin 14.6, hematocrit 43.4, platelet count 154. D-dimer is 0.5.   The patient did have a CT scan of the chest done with contrast showing findings consistent with ligament disease in the left upper chest and hilar region possibly related to post therapy changes. No definite pulmonary embolism noted. Left upper lobe pulmonary artery is not demonstrated well due to severe scarring.   ASSESSMENT AND PLAN: This is a 74 year old male with past medical history of coronary artery disease status post recent stent placement, hypertension, hyperlipidemia, history of lung cancer status post chemoradiation, and chronic obstructive pulmonary disease who presents to the hospital with shortness of breath and chest pain.  1. Chest pain/shortness of breath. The exact etiology is currently unclear. Since the patient has a history of lung cancer and COPD, he underwent a CT chest which showed no acute pulmonary embolism but the left upper lobe could not be visualized well due to severe scarring from previous cancer and radiation treatment. My suspicion for PE is low as he is not severely hypoxic with O2 sats of 95% on room air. I'm wondering if this chest pain and shortness of breath is anginal equivalent given his recent cath and fairly calcified proximal LAD and stent placement. For now I will observe him on telemetry. Check serial cardiac markers. Continue his aspirin, beta-blocker statin, and nitro for now. I discussed the case with Dr. Kirke CorinArida who plans on doing a cardiac cath on this patient tomorrow morning. Follow clinically for now.  2. Hypertension, presently hemodynamically stable. Continue metoprolol.  3. Hyperlipidemia. Continue atorvastatin.  4. Chronic obstructive  pulmonary disease. No evidence of any acute exacerbation. Will continue his Spiriva.  5. History of lung cancer. The patient is followed by Dr. Doylene Canninghoksi. Had a recent PET scan in February which was negative. His CT of the chest presently is negative for PE except the left upper lobe could not be visualized and, as mentioned, my suspicion for PE is low presently.   CODE STATUS: The patient is a FULL CODE.   TIME SPENT: 50 minutes.   ____________________________ Rolly PancakeVivek J. Cherlynn KaiserSainani, MD vjs:drc D: 12/26/2011 18:43:35 ET T: 12/27/2011 06:20:07 ET JOB#: [id number]  cc: Rolly PancakeVivek J. Cherlynn KaiserSainani, MD, <Dictator> Bari EdwardLaura Berglund, MD Houston SirenVIVEK J SAINANI MD ELECTRONICALLY SIGNED 12/29/2011 8:11

## 2014-11-09 NOTE — Consult Note (Signed)
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 Oliva Bustard, status post chemoradiation comes to the hospital complaining of chest pressure that started this morning. Cardiology was consulted this afternoon for chest pain and elevated cardiac enz.  Patient reports pauin started at 7 pm and persisted until 9 pm. It resolved in the ER. Patient denies any prior cardiac history or prior episodes of chest pain. He has had worsening SOB over the past few months.  He said he was relaxing this morning watching TV and developed  tight discomfort in the chest, very excruciating,  and also noticed some tingling of the left hand fingers. His wife drove him to the hospital.   He did have a CT of the chest to rule out PE which was negative. second troponin was 30. He did receive a second dose of lovenox SQ at noon.  When he was seen my our service in teh ER, he was comfortable, no complaints.   Recently was in urgent care clinic for palpitations about a month ago when his heart rate went up as high as 110; thought secondary to Spiriva and was discharged home.   PAST MEDICAL HISTORY:  1. 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  2. Chronic obstructive pulmonary disease.  3. Ex-smoker.   PAST SURGICAL HISTORY:  1. Hypopharyngeal abscess surgical resection in 2010. 2. Cataract surgery.  3. Hemorrhoid surgery.  4. Plastic surgery on his right hand for trauma.    Present Illness . ALLERGIES TO MEDICATIONS: Levaquin.   CURRENT MEDICATIONS AT HOME:  1. Spiriva inhaler daily.  2. Vitamin B12 tablet 1 tablet daily.  3. Vitamin D3 1000 international units daily.  4. TUMS 2 tablets p.o. b.i.d.   SOCIAL HISTORY: Lives at  home with wife. Quit smoking about 3 to 4 years ago; prior to that used to smoke about 2 packs per day. >50 years smoking hx, Drinks beer, at least 3 to 4 every day.   FAMILY HISTORY: Parents were separated, does not know much about his dad or his side of the family. Dad died age 36s with heart problems. Mom passed away in her 54s, had pancreatic cancer. Everybody on mom???s side of the family had heart valve disease and requiring replacement.   Physical Exam:   GEN well developed, well nourished, no acute distress, thin and Tan    HEENT red conjunctivae    NECK supple  No masses    RESP normal resp effort  mildly decreased BS    CARD Regular rate and rhythm  Normal, S1, S2  Murmur    ABD denies tenderness  soft    EXTR negative edema    SKIN normal to palpation    NEURO cranial nerves intact, motor/sensory function intact    PSYCH alert, A+O to time, place, person, good insight   Review of Systems:   Subjective/Chief Complaint Chest pain, now resolved    General: SOB    Skin: No Complaints    ENT: No Complaints    Eyes: No Complaints    Neck: No Complaints    Respiratory: Short of breath    Cardiovascular: Chest pain or discomfort  Dyspnea    Gastrointestinal: No Complaints    Genitourinary: No Complaints  Vascular: No Complaints    Musculoskeletal: No Complaints    Neurologic: No Complaints    Hematologic: No Complaints    Endocrine: No Complaints    Psychiatric: No Complaints    Review of Systems: All other systems were reviewed and found to be negative    Medications/Allergies Reviewed Medications/Allergies reviewed     compression frx T spine.  ?Spinal stenosis?:    emphysema:    lung cancer:    COPD:    COPD:    Rectal Polyups:    Hemorrhoidectomy:    Eppiglottis, Cyst Removed:    Cataract Right Eye:    colon resection:    Cataract Extraction - right eye:        Admit Diagnosis:   MI: 26-Oct-2011, Active, MI      Admit  Reason:   MI (myocardial infarction): (410.90) Active, ICD9, Acute myocardial infarction, unspecified site, episode of care unspecified  Home Medications: Medication Instructions Status  Spiriva 18 mcg capsule 1 ea inhaled once a day x 30 days Active  Vitamin B12 1 tab(s) orally once a day. **pt not sure of strength** Active  Vitamin D3 1000 intl units oral tablet 1 tab(s) orally once a day Active  Tums 2 tab(s) orally 2 times a day Active     Cardiac:  10-Apr-13 08:05    Troponin I < 0.02   CK, Total 66   CPK-MB, Serum 1.3  Routine Hem:  10-Apr-13 08:05    WBC (CBC) 5.6   RBC (CBC) 5.10   Hemoglobin (CBC) 15.8   Hematocrit (CBC) 47.8   Platelet Count (CBC) 169   MCV 94   MCH 31.0   MCHC 33.0   RDW 12.5  Routine Chem:  10-Apr-13 08:05    Glucose, Serum 114   BUN 15   Creatinine (comp) 0.96   Sodium, Serum 139   Potassium, Serum 4.0   Chloride, Serum 103   CO2, Serum 25   Calcium (Total), Serum 9.3  Hepatic:  10-Apr-13 08:05    Bilirubin, Total 1.4   Alkaline Phosphatase 77   SGPT (ALT) 23   SGOT (AST) 19   Total Protein, Serum 7.6   Albumin, Serum 4.3  Routine Chem:  10-Apr-13 08:05    Osmolality (calc) 279   eGFR (African American) >60   eGFR (Non-African American) >60   Anion Gap 11   B-Type Natriuretic Peptide ([facility]) 74   Cholesterol, Serum 203   Triglycerides, Serum 101   HDL (INHOUSE) 54   VLDL Cholesterol Calculated 20   LDL Cholesterol Calculated 129  Cardiac:  10-Apr-13 10:56    Troponin I 30.00   CK, Total 966   CPK-MB, Serum 127.9   EKG:   Interpretation EKG at 8 AM shows ST ABN in V1 to V3, NSr with rate 78 bpm Repeat EKG at noon shows NSR with rate 89 bpm, essentially resolved ST ABN apart from lead V3, PVCs   Radiology Results: XRay:    10-Apr-13 08:21, Chest Portable Single View   Chest Portable Single View    REASON FOR EXAM:    Chest Pain  COMMENTS:       PROCEDURE: DXR - DXR PORTABLE CHEST SINGLE VIEW  - Oct 26 2011  8:21AM      RESULT: Comparison is made to the prior exam of 08/18/2011. There is again   observed increased density lateral to left hilar region consistent with   postsurgical and postradiation changes. No recurrent bulk tumor is seen.  No pneumonia, pneumothorax or pleural effusion is identified. Heart size   is normal. The lungs are bilaterally hyperinflated compatible with a   historyof COPD.     IMPRESSION:   1. Stable appearing chest as compared to the exam of 08/18/2011.  2. Again noted is increased density at the level of the left hilum     consistent with postsurgical and postradiation changes.  3. No recurrent bulk tumor is seen.      Thank you for the opportunity to contribute to the care of your patient.           Verified By: Dionne Ano WALL, M.D., MD  CT:    10-Apr-13 09:42, CT Chest for Pulm Embolism With Contrast   CT Chest for Pulm Embolism With Contrast    REASON FOR EXAM:    chest pain, h/o lung cancer  COMMENTS:       PROCEDURE: CT  - CT CHEST (FOR PE) W  - Oct 26 2011  9:42AM     RESULT: Indications: Chest pain    Comparison: 08/07/1911, 02/07/2011, 08/31/2010, PET CT 09/12/2011    Technique: A thin-section spiral CT from the lung apices to the upper   abdomen was acquired on a multi slice scanner following 158m [id number]-370   intravenous contrast. These images were then transferred to the Siemens   work station and were subsequently reviewed utilizing 3-D reconstructions   and MIP images.    Findings:     There is adequate opacification of the pulmonary arteries. There is no   pulmonary embolus. The main pulmonary artery, right main pulmonary   artery, and left main pulmonary arteries are normalin size. The heart   size is normal. There is no pericardial effusion. There is coronary   artery atherosclerosis.    Again noted is a 9 mm spiculated density at the right lung apex unchanged   compared with 03/02/2010. This may be related to scarring. There is   moderate  bilateral emphysematous change. There is a bandlike opacity in   the left upper lobe and lingula unchanged from the prior examination of   07/19/2011. There is pleural thickening versus a small loculated pleural   effusion in the left upper hemithorax.    There is no axillary, hilar, or mediastinal adenopathy.    There is a T7 vertebral body compression fracture.    The visualized portions of the upper abdomen are unremarkable.    IMPRESSION:      1. No CT evidence of pulmonaryembolus.    2. The pulmonary findings as described above most significant in the left   upper lobe which may be related to prior radiation therapy. Recurrent or   residual malignancy cannot be entirely excluded given that there was   increased metabolic activity on the recent PET/CT of dated 09/12/2011.      Verified By: HJennette Banker M.D., MD    ASA: Unknown  Levaquin: Resp. Distress, Rash, Hives  Vital Signs/Nurse's Notes: **Vital Signs.:   10-Apr-13 16:18   Vital Signs Type Routine   Temperature Temperature (F) 97.8   Celsius 36.5   Temperature Source axillary   Pulse Pulse 88   Pulse source per Dinamap   Respirations Respirations 16   Systolic BP Systolic BP 1144  Diastolic BP (mmHg) Diastolic BP (mmHg) 83   Mean BP 104   BP Source Dinamap   Pulse Ox % Pulse Ox % 96   Pulse Ox Activity Level  At rest  Oxygen Delivery Room Air/ 21 %     Impression Alexander [name] is a 74 year old Caucasian male with past medical history significant for chronic obstructive pulmonary disease, ex-smoker, >50 yr smoking hx, 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 Oliva Bustard, status post chemoradiation comes to the hospital complaining of chest pressure that started this morning. Cardiac enz elevation consistent with NSTEMI.  A/P: 1) NSTEMI Needs cardiac cath.  He received lovenox in the ER at noon. This anticoagulation places him at higher risk of bleed during the cardiac cath  (groin complications). As he is pain free since 9 Am, will schedule for cardiac cath in AM (hold lovenox 8 hrs before) Will continue asa, Ntg patch or paste, b-blocker, statin NTG SL for chest pain echo pending  2) COPD: long smoking hx. worsening SOB recently suspect may be secondary to angina, unable to exclude COPD   3) Lung CA Will defer to oncology May want bare metal stent in case intervention is needed. Patienty denies any plans for intervention.   Electronic Signatures: Ida Rogue (MD)  (Signed 10-Apr-13 18:37)  Authored: General Aspect/Present Illness, History and Physical Exam, Review of System, Past Medical History, Health Issues, Home Medications, Labs, EKG , Radiology, Allergies, Vital Signs/Nurse's Notes, Impression/Plan   Last Updated: 10-Apr-13 18:37 by Ida Rogue (MD)
